# Patient Record
Sex: Female | Born: 1960
Health system: Southern US, Community
[De-identification: ages and names within clinical notes are randomized; demographics above are authoritative.]

## PROBLEM LIST (undated history)

## (undated) DIAGNOSIS — B009 Herpesviral infection, unspecified: Secondary | ICD-10-CM

## (undated) DIAGNOSIS — M1990 Unspecified osteoarthritis, unspecified site: Secondary | ICD-10-CM

## (undated) DIAGNOSIS — E041 Nontoxic single thyroid nodule: Secondary | ICD-10-CM

## (undated) DIAGNOSIS — F419 Anxiety disorder, unspecified: Secondary | ICD-10-CM

## (undated) DIAGNOSIS — I10 Essential (primary) hypertension: Secondary | ICD-10-CM

## (undated) DIAGNOSIS — F329 Major depressive disorder, single episode, unspecified: Secondary | ICD-10-CM

## (undated) DIAGNOSIS — F32A Depression, unspecified: Secondary | ICD-10-CM

## (undated) DIAGNOSIS — H409 Unspecified glaucoma: Secondary | ICD-10-CM

## (undated) DIAGNOSIS — M8580 Other specified disorders of bone density and structure, unspecified site: Secondary | ICD-10-CM

## (undated) HISTORY — DX: Nontoxic single thyroid nodule: E04.1

## (undated) HISTORY — PX: ABDOMINAL HYSTERECTOMY: SHX81

## (undated) HISTORY — DX: Unspecified osteoarthritis, unspecified site: M19.90

## (undated) HISTORY — DX: Major depressive disorder, single episode, unspecified: F32.9

## (undated) HISTORY — DX: Essential (primary) hypertension: I10

## (undated) HISTORY — DX: Other specified disorders of bone density and structure, unspecified site: M85.80

## (undated) HISTORY — PX: BREAST BIOPSY: SHX20

## (undated) HISTORY — DX: Depression, unspecified: F32.A

## (undated) HISTORY — PX: BREAST LUMPECTOMY: SHX2

## (undated) HISTORY — DX: Herpesviral infection, unspecified: B00.9

## (undated) HISTORY — DX: Unspecified glaucoma: H40.9

## (undated) HISTORY — DX: Anxiety disorder, unspecified: F41.9

## (undated) HISTORY — PX: HERNIA REPAIR: SHX51

---

## 2012-01-12 ENCOUNTER — Ambulatory Visit (INDEPENDENT_AMBULATORY_CARE_PROVIDER_SITE_OTHER): Payer: PRIVATE HEALTH INSURANCE | Admitting: Internal Medicine

## 2012-01-12 ENCOUNTER — Encounter: Payer: Self-pay | Admitting: Internal Medicine

## 2012-01-12 VITALS — BP 134/86 | HR 64 | Temp 97.6°F | Resp 20 | Ht 63.0 in | Wt 143.0 lb

## 2012-01-12 DIAGNOSIS — B009 Herpesviral infection, unspecified: Secondary | ICD-10-CM | POA: Insufficient documentation

## 2012-01-12 DIAGNOSIS — F329 Major depressive disorder, single episode, unspecified: Secondary | ICD-10-CM

## 2012-01-12 DIAGNOSIS — F419 Anxiety disorder, unspecified: Secondary | ICD-10-CM

## 2012-01-12 DIAGNOSIS — F1020 Alcohol dependence, uncomplicated: Secondary | ICD-10-CM

## 2012-01-12 DIAGNOSIS — I10 Essential (primary) hypertension: Secondary | ICD-10-CM

## 2012-01-12 DIAGNOSIS — F411 Generalized anxiety disorder: Secondary | ICD-10-CM

## 2012-01-12 DIAGNOSIS — Z0000 Encounter for general adult medical examination without abnormal findings: Secondary | ICD-10-CM

## 2012-01-12 LAB — COMPREHENSIVE METABOLIC PANEL
AST: 17 U/L (ref 0–37)
Albumin: 4.4 g/dL (ref 3.5–5.2)
Alkaline Phosphatase: 53 U/L (ref 39–117)
BUN: 12 mg/dL (ref 6–23)
Potassium: 4.1 mEq/L (ref 3.5–5.3)
Sodium: 137 mEq/L (ref 135–145)
Total Bilirubin: 0.5 mg/dL (ref 0.3–1.2)
Total Protein: 7.3 g/dL (ref 6.0–8.3)

## 2012-01-12 LAB — CBC WITH DIFFERENTIAL/PLATELET
Basophils Absolute: 0 10*3/uL (ref 0.0–0.1)
Basophils Relative: 0 % (ref 0–1)
Eosinophils Absolute: 0.1 10*3/uL (ref 0.0–0.7)
MCHC: 33.9 g/dL (ref 30.0–36.0)
Neutro Abs: 5.2 10*3/uL (ref 1.7–7.7)
Neutrophils Relative %: 61 % (ref 43–77)
RDW: 14.5 % (ref 11.5–15.5)

## 2012-01-12 LAB — LIPID PANEL
HDL: 87 mg/dL (ref >39)
LDL Cholesterol: 87 mg/dL (ref 0–99)
VLDL: 23 mg/dL (ref 0–40)

## 2012-01-12 LAB — TSH: TSH: 1.037 u[IU]/mL (ref 0.350–4.500)
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
## 2012-01-12 NOTE — Patient Instructions (Addendum)
Call [facility]  [phone]  Ask for Dr. Donell Beers     Labs will be mailed to you

## 2012-01-13 ENCOUNTER — Encounter: Payer: Self-pay | Admitting: Internal Medicine

## 2012-01-13 DIAGNOSIS — I10 Essential (primary) hypertension: Secondary | ICD-10-CM | POA: Insufficient documentation

## 2012-01-13 DIAGNOSIS — F1020 Alcohol dependence, uncomplicated: Secondary | ICD-10-CM | POA: Insufficient documentation

## 2012-01-13 DIAGNOSIS — F329 Major depressive disorder, single episode, unspecified: Secondary | ICD-10-CM | POA: Insufficient documentation

## 2012-01-13 DIAGNOSIS — F419 Anxiety disorder, unspecified: Secondary | ICD-10-CM | POA: Insufficient documentation

## 2012-01-18 ENCOUNTER — Telehealth: Payer: Self-pay | Admitting: Internal Medicine

## 2012-01-18 NOTE — Telephone Encounter (Signed)
Spoke with pt and informed of lab results  Her appt with psychiatry is pending

## 2012-01-20 NOTE — Addendum Note (Signed)
Addended by: Durwin Nora B on: 01/20/2012 03:23 PM   Modules accepted: Orders

## 2012-01-20 NOTE — ED Provider Notes (Signed)
Order(s) created erroneously. Erroneous order ID: [id number] Order moved by: Lurline Hare Order move date/time: 01/20/2012  3:20 PM Source Patient:   [id number] Source Contact: 01/12/2012 Destination Patient:   [id number] Destination Contact: 02/15/2011

## 2012-01-30 ENCOUNTER — Encounter: Payer: Self-pay | Admitting: Internal Medicine

## 2012-01-30 DIAGNOSIS — R921 Mammographic calcification found on diagnostic imaging of breast: Secondary | ICD-10-CM | POA: Insufficient documentation

## 2012-01-30 DIAGNOSIS — Z862 Personal history of diseases of the blood and blood-forming organs and certain disorders involving the immune mechanism: Secondary | ICD-10-CM | POA: Insufficient documentation

## 2012-01-30 DIAGNOSIS — Z90710 Acquired absence of both cervix and uterus: Secondary | ICD-10-CM | POA: Insufficient documentation

## 2012-01-30 DIAGNOSIS — E042 Nontoxic multinodular goiter: Secondary | ICD-10-CM | POA: Insufficient documentation

## 2012-01-30 DIAGNOSIS — Z86010 Personal history of colonic polyps: Secondary | ICD-10-CM | POA: Insufficient documentation

## 2012-02-08 ENCOUNTER — Other Ambulatory Visit: Payer: Self-pay | Admitting: Internal Medicine

## 2012-02-08 ENCOUNTER — Other Ambulatory Visit: Payer: Self-pay | Admitting: *Deleted

## 2012-02-08 MED ORDER — AMLODIPINE BESY-BENAZEPRIL HCL 5-10 MG PO CAPS: 1.0000 | ORAL_CAPSULE | Freq: Every day | ORAL | Status: DC

## 2012-02-08 MED ORDER — HYDROCHLOROTHIAZIDE 12.5 MG PO CAPS: 12.5000 mg | ORAL_CAPSULE | Freq: Every day | ORAL | Status: DC

## 2012-02-08 NOTE — Telephone Encounter (Signed)
Left message for pt regarding pharmacy information awaiting return call

## 2012-02-08 NOTE — Telephone Encounter (Signed)
Anna Werner    Call in this and put pts pharmacy in chart

## 2012-02-08 NOTE — Telephone Encounter (Signed)
Pt needs refills on HCTZ and Lotrel 5/10 mg.  Also needs Clindamycin 1 % gel & Mometasone cream 0.1%.  Call [facility] [phone].  Pt phone number [phone].

## 2012-02-08 NOTE — Telephone Encounter (Signed)
Pharmacy updated.

## 2012-02-08 NOTE — Telephone Encounter (Signed)
Pt returned call regarding pharmacy info will update it in chart

## 2012-02-09 ENCOUNTER — Other Ambulatory Visit: Payer: Self-pay | Admitting: *Deleted

## 2012-02-11 ENCOUNTER — Telehealth: Payer: Self-pay | Admitting: *Deleted

## 2012-02-11 ENCOUNTER — Other Ambulatory Visit: Payer: Self-pay | Admitting: *Deleted

## 2012-02-11 MED ORDER — MOMETASONE FUROATE 0.1 % EX CREA: TOPICAL_CREAM | Freq: Every day | CUTANEOUS | Status: DC

## 2012-02-11 MED ORDER — CLINDAMYCIN PHOSPHATE 1 % EX GEL: Freq: Two times a day (BID) | CUTANEOUS | Status: DC

## 2012-02-11 NOTE — Addendum Note (Signed)
Addended by: Raechel Chute D on: 02/11/2012 12:21 PM   Modules accepted: Orders

## 2012-02-11 NOTE — Telephone Encounter (Signed)
lotrel and HCTZ called in again (pt had given wrong pharmacy) Anna Werner, also spoke with pt regarding prescriber and reason for abx cream

## 2012-02-14 ENCOUNTER — Telehealth: Payer: Self-pay | Admitting: *Deleted

## 2012-02-14 NOTE — Telephone Encounter (Signed)
Pt returned call

## 2012-02-21 ENCOUNTER — Telehealth: Payer: Self-pay | Admitting: *Deleted

## 2012-02-21 NOTE — Telephone Encounter (Signed)
Left message for pt regarding her incoming call Friday for an appt for groin and back pain

## 2012-02-25 ENCOUNTER — Encounter: Payer: Self-pay | Admitting: *Deleted

## 2012-03-15 ENCOUNTER — Ambulatory Visit (INDEPENDENT_AMBULATORY_CARE_PROVIDER_SITE_OTHER): Payer: PRIVATE HEALTH INSURANCE | Admitting: Internal Medicine

## 2012-03-15 ENCOUNTER — Encounter: Payer: Self-pay | Admitting: Internal Medicine

## 2012-03-15 VITALS — BP 124/76 | HR 71 | Temp 97.5°F | Resp 18 | Wt 142.0 lb

## 2012-03-15 DIAGNOSIS — M1990 Unspecified osteoarthritis, unspecified site: Secondary | ICD-10-CM

## 2012-03-15 DIAGNOSIS — M129 Arthropathy, unspecified: Secondary | ICD-10-CM

## 2012-03-15 DIAGNOSIS — M2550 Pain in unspecified joint: Secondary | ICD-10-CM

## 2012-03-15 LAB — CBC WITH DIFFERENTIAL/PLATELET
Basophils Absolute: 0 10*3/uL (ref 0.0–0.1)
Basophils Relative: 0 % (ref 0–1)
Eosinophils Absolute: 0.1 10*3/uL (ref 0.0–0.7)
Eosinophils Relative: 1 % (ref 0–5)
MCH: 24.6 pg — ABNORMAL LOW (ref 26.0–34.0)
MCV: 73.8 fL — ABNORMAL LOW (ref 78.0–100.0)
Platelets: 278 10*3/uL (ref 150–400)
RDW: 14.5 % (ref 11.5–15.5)
WBC: 9.3 10*3/uL (ref 4.0–10.5)

## 2012-03-15 MED ORDER — NABUMETONE 500 MG PO TABS: 500.0000 mg | ORAL_TABLET | Freq: Two times a day (BID) | ORAL | Status: DC
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
## 2012-03-15 NOTE — Patient Instructions (Addendum)
Check labs on my chart  Will refer to Dr. Corliss Skains

## 2012-03-20 ENCOUNTER — Encounter: Payer: Self-pay | Admitting: *Deleted

## 2012-03-21 ENCOUNTER — Telehealth: Payer: Self-pay | Admitting: *Deleted

## 2012-03-21 NOTE — Telephone Encounter (Signed)
Labs mailed to pt home address.

## 2012-04-24 ENCOUNTER — Other Ambulatory Visit: Payer: Self-pay | Admitting: Internal Medicine

## 2012-04-24 MED ORDER — ALPRAZOLAM 0.5 MG PO TABS: 0.5000 mg | ORAL_TABLET | Freq: Every evening | ORAL | Status: DC | PRN

## 2012-04-24 NOTE — Telephone Encounter (Signed)
Xanax called in to [facility]

## 2012-04-24 NOTE — Telephone Encounter (Signed)
Will phone in pending approval

## 2012-04-24 NOTE — Telephone Encounter (Signed)
Ok to phone in.

## 2012-04-24 NOTE — Telephone Encounter (Signed)
Pt needs refill on her ALPRAZolam (XANAX) 0.5 MG tablet Sent to [facility] [address] Pt states she does not has any medication left .Marland KitchenMarland Kitchen

## 2012-04-25 ENCOUNTER — Telehealth: Payer: Self-pay | Admitting: *Deleted

## 2012-04-25 NOTE — Telephone Encounter (Signed)
Called in rx for xanax again

## 2012-06-15 ENCOUNTER — Other Ambulatory Visit: Payer: Self-pay | Admitting: Internal Medicine

## 2012-06-16 ENCOUNTER — Other Ambulatory Visit: Payer: Self-pay | Admitting: *Deleted

## 2012-06-16 NOTE — Telephone Encounter (Signed)
Xanax called in to [facility] 

## 2012-06-16 NOTE — Telephone Encounter (Signed)
Will call in pending approval 

## 2012-06-24 ENCOUNTER — Other Ambulatory Visit: Payer: Self-pay

## 2012-06-29 LAB — HM COLONOSCOPY

## 2012-07-02 ENCOUNTER — Encounter: Payer: Self-pay | Admitting: Internal Medicine

## 2012-08-24 ENCOUNTER — Telehealth: Payer: Self-pay | Admitting: Internal Medicine

## 2012-08-24 NOTE — Telephone Encounter (Signed)
Pt needs refill for Lotrel and Xanax.  To be called into pharmacy [address] [phone].  Pt scheduled for CPE on December 13, 2012.  Call back phone number for pt [phone].

## 2012-08-28 ENCOUNTER — Other Ambulatory Visit: Payer: Self-pay | Admitting: *Deleted

## 2012-08-28 ENCOUNTER — Other Ambulatory Visit: Payer: Self-pay | Admitting: Internal Medicine

## 2012-08-28 MED ORDER — AMLODIPINE BESY-BENAZEPRIL HCL 5-10 MG PO CAPS: 1.0000 | ORAL_CAPSULE | Freq: Every day | ORAL | Status: DC

## 2012-08-28 NOTE — Telephone Encounter (Signed)
Refill request

## 2012-08-29 ENCOUNTER — Other Ambulatory Visit: Payer: Self-pay | Admitting: *Deleted

## 2012-08-29 NOTE — Telephone Encounter (Signed)
Will call in pending approval 

## 2012-08-30 MED ORDER — ALPRAZOLAM 0.5 MG PO TABS: ORAL_TABLET | ORAL | Status: DC

## 2012-09-06 ENCOUNTER — Telehealth: Payer: Self-pay | Admitting: Internal Medicine

## 2012-09-06 NOTE — Telephone Encounter (Signed)
Left message on pts mobile to call me at the office to discuss extreme breast density on mm report

## 2012-09-07 ENCOUNTER — Telehealth: Payer: Self-pay | Admitting: Internal Medicine

## 2012-09-07 DIAGNOSIS — R922 Inconclusive mammogram: Secondary | ICD-10-CM | POA: Insufficient documentation

## 2012-09-07 DIAGNOSIS — R9230 Dense breasts, unspecified: Secondary | ICD-10-CM

## 2012-09-07 NOTE — Telephone Encounter (Signed)
Spoke with pt and infromed of extreme breast density of mm.  She would like a 3-D mm  Will set up at [facility]

## 2012-09-12 ENCOUNTER — Encounter: Payer: Self-pay | Admitting: *Deleted

## 2012-09-20 ENCOUNTER — Other Ambulatory Visit: Payer: Self-pay | Admitting: Internal Medicine

## 2012-09-26 ENCOUNTER — Other Ambulatory Visit: Payer: Self-pay | Admitting: *Deleted

## 2012-09-26 NOTE — Telephone Encounter (Signed)
Refill request

## 2012-09-27 ENCOUNTER — Telehealth: Payer: Self-pay | Admitting: *Deleted

## 2012-09-27 MED ORDER — HYDROCHLOROTHIAZIDE 12.5 MG PO CAPS: 12.5000 mg | ORAL_CAPSULE | Freq: Every day | ORAL | Status: DC

## 2012-09-27 NOTE — Telephone Encounter (Signed)
HCTZ called in to pharmacy

## 2012-11-07 ENCOUNTER — Other Ambulatory Visit: Payer: Self-pay | Admitting: *Deleted

## 2012-11-07 MED ORDER — ALPRAZOLAM 0.5 MG PO TABS: ORAL_TABLET | ORAL | Status: DC

## 2012-11-07 NOTE — Telephone Encounter (Signed)
Ok to call in #20 no RF

## 2012-11-07 NOTE — Telephone Encounter (Signed)
Will call in pending approval 

## 2012-11-13 ENCOUNTER — Other Ambulatory Visit: Payer: Self-pay | Admitting: *Deleted

## 2012-11-13 NOTE — Telephone Encounter (Signed)
Xanax called into pharmacy.

## 2012-11-28 ENCOUNTER — Ambulatory Visit: Payer: PRIVATE HEALTH INSURANCE | Admitting: Internal Medicine

## 2012-12-13 ENCOUNTER — Ambulatory Visit (INDEPENDENT_AMBULATORY_CARE_PROVIDER_SITE_OTHER): Payer: PRIVATE HEALTH INSURANCE | Admitting: Internal Medicine

## 2012-12-13 ENCOUNTER — Ambulatory Visit (HOSPITAL_BASED_OUTPATIENT_CLINIC_OR_DEPARTMENT_OTHER)
Admission: RE | Admit: 2012-12-13 | Discharge: 2012-12-13 | Disposition: A | Discharge status: Home / Self Care | Payer: PRIVATE HEALTH INSURANCE | Source: Ambulatory Visit | Attending: Internal Medicine | Admitting: Internal Medicine

## 2012-12-13 ENCOUNTER — Encounter: Payer: Self-pay | Admitting: Internal Medicine

## 2012-12-13 VITALS — BP 128/79 | HR 69 | Resp 16 | Ht 63.0 in | Wt 146.0 lb

## 2012-12-13 DIAGNOSIS — R001 Bradycardia, unspecified: Secondary | ICD-10-CM | POA: Insufficient documentation

## 2012-12-13 DIAGNOSIS — R921 Mammographic calcification found on diagnostic imaging of breast: Secondary | ICD-10-CM

## 2012-12-13 DIAGNOSIS — Z8639 Personal history of other endocrine, nutritional and metabolic disease: Secondary | ICD-10-CM

## 2012-12-13 DIAGNOSIS — F1020 Alcohol dependence, uncomplicated: Secondary | ICD-10-CM

## 2012-12-13 DIAGNOSIS — R928 Other abnormal and inconclusive findings on diagnostic imaging of breast: Secondary | ICD-10-CM

## 2012-12-13 DIAGNOSIS — I10 Essential (primary) hypertension: Secondary | ICD-10-CM

## 2012-12-13 DIAGNOSIS — Z23 Encounter for immunization: Secondary | ICD-10-CM

## 2012-12-13 DIAGNOSIS — Z139 Encounter for screening, unspecified: Secondary | ICD-10-CM

## 2012-12-13 DIAGNOSIS — Z0000 Encounter for general adult medical examination without abnormal findings: Secondary | ICD-10-CM

## 2012-12-13 DIAGNOSIS — E049 Nontoxic goiter, unspecified: Secondary | ICD-10-CM | POA: Insufficient documentation

## 2012-12-13 DIAGNOSIS — Z90710 Acquired absence of both cervix and uterus: Secondary | ICD-10-CM

## 2012-12-13 DIAGNOSIS — Z862 Personal history of diseases of the blood and blood-forming organs and certain disorders involving the immune mechanism: Secondary | ICD-10-CM

## 2012-12-13 LAB — POCT URINALYSIS DIPSTICK
Protein, UA: NEGATIVE
Spec Grav, UA: 1.01
Urobilinogen, UA: 0.2
pH, UA: 7

## 2012-12-13 NOTE — Addendum Note (Signed)
Addended by: Arne Cleveland on: 12/13/2012 06:29 PM   Modules accepted: Orders

## 2012-12-13 NOTE — Progress Notes (Signed)
Subjective:    Patient ID: Anna Werner, female    DOB: 1960-09-01, 52 y.o.   MRN: [id number]  HPI Anna Werner is here for CPE  She tells me she has been sober for 30- days.  Had relapse with ETOH one month ago.  She is not attending any AA meetings now  MNG  Has history of thyroid nodule No symptoms  Extreme breast density  HTN welll controlled  She has lump she feels on R side of scalp that as been present for many years  densies injury or trauma  No Known Allergies Past Medical History  Diagnosis Date  . Anxiety   . Depression   . Arthritis   . Hypertension   . HSV-2 infection    Past Surgical History  Procedure Laterality Date  . Abdominal hysterectomy    . Hernia repair    . Breast lumpectomy     History   Social History  . Marital Status: Divorced    Spouse Name: N/A    Number of Children: N/A  . Years of Education: N/A   Occupational History  . Not on file.   Social History Main Topics  . Smoking status: Former Smoker    Types: Cigarettes  . Smokeless tobacco: Not on file  . Alcohol Use: 1.2 oz/week    2 Glasses of wine per week  . Drug Use: No  . Sexually Active: Yes   Other Topics Concern  . Not on file   Social History Narrative  . No narrative on file   History reviewed. No pertinent family history. Patient Active Problem List   Diagnosis Date Noted  . Breast density 09/07/2012  . Arthralgia 03/15/2012  . Hx of colonic polyps 01/30/2012  . S/P hysterectomy 01/30/2012  . History of anemia 01/30/2012  . Breast calcifications on mammogram 01/30/2012  . History of thyroid nodule 01/30/2012  . Anxiety 01/13/2012  . Depression 01/13/2012  . EtOH dependence 01/13/2012  . Essential hypertension, benign 01/13/2012  . HSV-2 infection    Current Outpatient Prescriptions on File Prior to Visit  Medication Sig Dispense Refill  . ALPRAZolam (XANAX) 0.5 MG tablet Take one tablet daily prn for anxiety  20 tablet  0  . amLODipine-benazepril (LOTREL)  5-10 MG per capsule Take 1 capsule by mouth daily.  90 capsule  1  . aspirin 81 MG tablet Take 81 mg by mouth daily.      . calcium carbonate 200 MG capsule Take 500 mg by mouth 2 (two) times daily with a meal.      . cholecalciferol (VITAMIN D) 1000 UNITS tablet Take 1,000 Units by mouth daily.      . clindamycin (CLINDAGEL) 1 % gel Apply topically 2 (two) times daily.  30 g  0  . cloNIDine (CATAPRES) 0.1 MG tablet Take 0.1 mg by mouth 2 (two) times daily.      . fish oil-omega-3 fatty acids 1000 MG capsule Take 2 g by mouth daily.      . hydrochlorothiazide (MICROZIDE) 12.5 MG capsule Take 1 capsule (12.5 mg total) by mouth daily.  90 capsule  1  . mometasone (ELOCON) 0.1 % cream Apply topically daily.  45 g  0  . Multiple Vitamins-Minerals (MULTI VITAMIN/MINERALS PO) Take by mouth.      . nabumetone (RELAFEN) 500 MG tablet Take 1 tablet (500 mg total) by mouth 2 (two) times daily.  60 tablet  0  . vitamin C (ASCORBIC ACID) 500 MG tablet Take 500 mg  by mouth daily.       No current facility-administered medications on file prior to visit.       Review of Systems    see HPI Objective:   Physical Exam  Physical Exam  Nursing note and vitals reviewed.  Constitutional: She is oriented to person, place, and time. She appears well-developed and well-nourished.  HENT:  Head: Normocephalic and atraumatic.  Cardiovascular: Normal rate and regular rhythm. Exam reveals no gallop and no friction rub.  No murmur heard.  Pulmonary/Chest: Breath sounds normal. She has no wheezes. She has no rales.  Neurological: She is alert and oriented to person, place, and time.  Skin: Skin is warm and dry. Scalp  She has easily mobile 1 cm nodule R parietal area. Psychiatric: She has a normal mood and affect. Her behavior is normal.       Assessment & Plan:  Health Maintenance will give Tdap today.  MM due 07/2013  She had colonoscopy within this past year  Dr. Marcelene Butte  She is S/P hysterectomy  EKG   Sinus bradycardia  HTN  Continue meds  Thyroid nodule  Will get thyroid u/s.  Further management based on results.  Pt counseled ot call for results after xray is performed  Scalp nodule  I gave number to [facility] dermatologist for pt to call    Arthralgias  She sees rheumatolgist for this  ETOH dependence  Relapse 30 days ago.  Encourage 12 step recorvery and [employer]

## 2012-12-14 ENCOUNTER — Other Ambulatory Visit (HOSPITAL_BASED_OUTPATIENT_CLINIC_OR_DEPARTMENT_OTHER): Payer: PRIVATE HEALTH INSURANCE

## 2012-12-14 LAB — CBC WITH DIFFERENTIAL/PLATELET
Basophils Relative: 0 % (ref 0–1)
HCT: 38.5 % (ref 36.0–46.0)
Hemoglobin: 13 g/dL (ref 12.0–15.0)
Lymphs Abs: 2.4 10*3/uL (ref 0.7–4.0)
MCH: 25.1 pg — ABNORMAL LOW (ref 26.0–34.0)
MCHC: 33.8 g/dL (ref 30.0–36.0)
Monocytes Absolute: 0.7 10*3/uL (ref 0.1–1.0)
Monocytes Relative: 9 % (ref 3–12)
Neutro Abs: 4 10*3/uL (ref 1.7–7.7)

## 2012-12-14 LAB — LIPID PANEL
LDL Cholesterol: 98 mg/dL (ref 0–99)
Total CHOL/HDL Ratio: 2.5 Ratio
Triglycerides: 71 mg/dL (ref <150)
VLDL: 14 mg/dL (ref 0–40)

## 2012-12-14 LAB — COMPREHENSIVE METABOLIC PANEL
ALT: 18 U/L (ref 0–35)
AST: 22 U/L (ref 0–37)
Creat: 0.71 mg/dL (ref 0.50–1.10)
Total Bilirubin: 0.3 mg/dL (ref 0.3–1.2)

## 2012-12-18 ENCOUNTER — Encounter: Payer: Self-pay | Admitting: *Deleted

## 2012-12-19 ENCOUNTER — Telehealth: Payer: Self-pay | Admitting: Internal Medicine

## 2012-12-19 DIAGNOSIS — E049 Nontoxic goiter, unspecified: Secondary | ICD-10-CM

## 2012-12-19 NOTE — Telephone Encounter (Signed)
I left voicemail of pt mobile to call office regarding ultrasound

## 2012-12-19 NOTE — Telephone Encounter (Signed)
Spoke with pt. And informed of ultrasound results  Will get endocrinology opinion

## 2013-01-16 ENCOUNTER — Ambulatory Visit (INDEPENDENT_AMBULATORY_CARE_PROVIDER_SITE_OTHER): Payer: PRIVATE HEALTH INSURANCE | Admitting: Internal Medicine

## 2013-01-16 ENCOUNTER — Encounter: Payer: Self-pay | Admitting: Internal Medicine

## 2013-01-16 VITALS — BP 118/84 | HR 81 | Temp 98.1°F | Resp 10 | Ht 64.0 in | Wt 156.0 lb

## 2013-01-16 DIAGNOSIS — Z862 Personal history of diseases of the blood and blood-forming organs and certain disorders involving the immune mechanism: Secondary | ICD-10-CM

## 2013-01-16 DIAGNOSIS — Z8639 Personal history of other endocrine, nutritional and metabolic disease: Secondary | ICD-10-CM

## 2013-01-16 NOTE — Patient Instructions (Addendum)
Please return in a year.  I will let you know about the result of your scan as soon as it becomes available.

## 2013-01-16 NOTE — Progress Notes (Addendum)
Patient ID: Anna Werner, female   DOB: 02-Apr-1961, 52 y.o.   MRN: [id number]   HPI  Anna Werner is a 52 y.o.-year-old female, referred by her PCP, Dr.Schoenhoff, for evaluation for MNG.  She was found to have 1 thyroid nodule in 2012, but had multiple nodules on a recent U/S 1 mo ago. Two nodules are larger, at 1 cm diameter, mixed, without internal calcifications but with internal blood flow.   I reviewed pt's thyroid tests: Lab Results  Component Value Date   TSH 0.958 12/13/2012   TSH 1.037 01/12/2012    Pt denies feeling nodules in neck, hoarseness, dysphagia/odynophagia, SOB with lying down, has a vague discomfort in right side of neck.  Pt c/o: - + heat intolerance = hot flushes - no tremors  - no anxiety/depression - no palpitations - no fatigue - + constipation - + weight gain - no dry skin  Pt does not have a FH of thyroid ds. No FH of thyroid cancer. No h/o radiation tx to head or neck.  No seaweed or kelp, no recent contrast studies. No steroid use. Takes herbal supplements: estroven, algae, cherry tart (for inflammation), alfalfa.   I reviewed her chart and she also has a history of anemia, anxiety.   ROS: Constitutional: see HPI Eyes: no blurry vision, no xerophthalmia ENT: no sore throat, no nodules palpated in throat, no dysphagia/odynophagia, no hoarseness Cardiovascular: no CP/SOB/palpitations/leg swelling Respiratory: no cough/SOB Gastrointestinal: no N/V/D/C Musculoskeletal: no muscle/joint aches Skin: no rashes Neurological: no tremors/numbness/tingling/dizziness Psychiatric: no depression/anxiety  Past Medical History  Diagnosis Date  . Anxiety   . Depression   . Arthritis   . Hypertension   . HSV-2 infection     Past Surgical History  Procedure Laterality Date  . Abdominal hysterectomy    . Hernia repair    . Breast lumpectomy      History   Social History  . Marital Status: Divorced    Spouse Name: N/A    Number of Children: 2    Occupational History  . LPN   Social History Main Topics  . Smoking status: Former Smoker    Types: Cigarettes - stopped 1996  . Smokeless tobacco: Not on file  . Alcohol Use: No  . Drug Use: No  . Sexual Activity: Yes   Social History Narrative   Regular exercise: no   Caffeine use: coffee daily   Current Outpatient Prescriptions on File Prior to Visit  Medication Sig Dispense Refill  . Alfalfa 250 MG TABS Take 1 tablet by mouth daily.      Marland Kitchen ALPRAZolam (XANAX) 0.5 MG tablet Take one tablet daily prn for anxiety  20 tablet  0  . amLODipine-benazepril (LOTREL) 5-10 MG per capsule Take 1 capsule by mouth daily.  90 capsule  1  . aspirin 81 MG tablet Take 81 mg by mouth daily.      . calcium carbonate 200 MG capsule Take 500 mg by mouth 2 (two) times daily with a meal.      . cholecalciferol (VITAMIN D) 1000 UNITS tablet Take 1,000 Units by mouth daily.      . clindamycin (CLINDAGEL) 1 % gel Apply topically 2 (two) times daily.  30 g  0  . cloNIDine (CATAPRES) 0.1 MG tablet Take 0.1 mg by mouth 2 (two) times daily.      . fish oil-omega-3 fatty acids 1000 MG capsule Take 2 g by mouth daily.      . hydrochlorothiazide (MICROZIDE) 12.5 MG  capsule Take 1 capsule (12.5 mg total) by mouth daily.  90 capsule  1  . Lactobacillus (ACIDOPHILUS) TABS Take 1 tablet by mouth daily.      . mometasone (ELOCON) 0.1 % cream Apply topically daily.  45 g  0  . Multiple Vitamins-Minerals (MULTI VITAMIN/MINERALS PO) Take by mouth.      . nabumetone (RELAFEN) 500 MG tablet Take 1 tablet (500 mg total) by mouth 2 (two) times daily.  60 tablet  0  . Nutritional Supplements (ESTROVEN PO) Take 1 tablet by mouth daily.      . vitamin C (ASCORBIC ACID) 500 MG tablet Take 500 mg by mouth daily.       No current facility-administered medications on file prior to visit.   No Known Allergies  History reviewed. No pertinent family history.  PE: BP 118/84  Pulse 81  Temp(Src) 98.1 F (36.7 C) (Oral)   Resp 10  Ht 5\' 4"  (1.626 m)  Wt 156 lb (70.761 kg)  BMI 26.76 kg/m2  SpO2 97% Wt Readings from Last 3 Encounters:  01/16/13 156 lb (70.761 kg)  12/13/12 146 lb (66.225 kg)  03/15/12 142 lb (64.411 kg)   Constitutional: slightly overweight, in NAD Eyes: PERRLA, EOMI, no exophthalmos ENT: moist mucous membranes, + thyromegaly - lumpy-bumpy at palpation, no cervical lymphadenopathy Cardiovascular: RRR, No MRG Respiratory: CTA B Gastrointestinal: abdomen soft, NT, ND, BS+ Musculoskeletal: no deformities, strength intact in all 4;  Skin: moist, warm, no rashes Neurological: no tremor with outstretched hands, DTR normal in all 4  ASSESSMENT: 1. MNG - thyroid U/S (12/13/2012):  Right thyroid lobe: Measures 7.5 x 1.9 x 1.9 cm   Left thyroid lobe: Measures 6.8 x 1.7 by 1.6 cm.   Isthmus: Measures 3 mm thickness.   Focal nodules: A nodule in the mid to superior aspect of the right lobe measures 1 x 0.5 x 0.7 cm. At least three additional smaller nodules are noted in the right lobe. A heterogeneous nodule inferior aspect of the left lobe measures 9 x 10 mm. A tiny nodule is noted in the mid aspect of the left lobe.   Lymphadenopathy: None visualized.  1. MNG  - I reviewed the images of her thyroid ultrasound along with the patient. I pointed out that the dominant nodules are not large, mixed, without calcifications, are well delimited from surrounding tissue, but they do have internal blood flow, which can appear if the nodule is hyperfunctioning or cancerous. Pt does not have a thyroid cancer family history or a personal history of RxTx to head/neck. All these would favor benignity. I believe her risk of cancer is less than15%.  - the only way that we can tell exactly if it is cancer or not is by doing a thyroid biopsy (FNA). I explained what the test entails. She is anxious to have this done. - We discussed about other options, to wait for another year and see if the nodule grows, and  only intervene at that time.  - I explained that this is not cancer, we can continue to follow her on a yearly basis, and check another ultrasound in another year or 2. - we also discussed about the indolent nature of the thyroid cancer and follow up after thyroidectomy if we need to go that route. - patient is anxious and does not think she can wait a year to know if the nodules are cancerous. However, she is reticent to have FNA done now - we decided to check  a thyroid scan to see if the nodules are hyperfunctioning ("hot") - if they are, no FNA is indicated as the risk of cancer is almost 0. I ordered this. Advised her to stop the algae supplement (increased iodine).  - I'll see her back in a year, assuming her FNA is normal. If FNA abnormal, we will meet sooner.  - I will send her the results through MyChart  Orders Placed This Encounter  Procedures  . NM THYROID SNG UPTAKE W/IMAGING   02/07/2013 Uptake and scan:  CLINICAL DATA: Thyroid nodules on ultrasound.  EXAM:  THYROID SCAN  TECHNIQUE:  Following the intravenous administration of radiopharmaceutical, pin  hole collimated images were obtained of the thyroid gland.  COMPARISON: Thyroid ultrasound 12/13/2012.  RADIOPHARMACEUTICALS: 12.2 mCi Tc-37m Pertechnetate  FINDINGS:  The thyroid uptake is mildly heterogeneous. There is a small "warm  nodule" laterally in the mid right lobe. There is a possible small  cold nodule inferiorly on the left. No other focal abnormalities are  identified.  IMPRESSION:  Heterogeneous thyroid uptake with small warm nodule on the right and  possible small cold nodule on the left. Based on prior ultrasound  findings, ultrasound follow up in 6 months suggested.  Electronically Signed  By: Roxy Horseman  On: 02/07/2013 16:40  I called and discussed with the patient about the above results, suggesting that, since the right thyroid nodule is" warm", we should not worry about it since the risk of cancer  is almost 0. However, on the left, there is a cold nodule, probably corresponding to the 1 x 0.9 cm nodule seen on ultrasound. I believe that these will need to be reevaluated, but, since the nodule is so small, I would suggest that we perform another ultrasound in a year to check for growth, and perform an FNA if it increases in size. I discussed with the patient that even if this turns out to be cancer,  1-year wait do not make a difference for  Prognosis. I also discussed that, if she prefers, we can also try to biopsy it, but that would not be my first choice. She agrees with the plan. I will see her back in a year but I advised her to let me know if he develops neck compression symptoms in the interim.

## 2013-01-18 ENCOUNTER — Other Ambulatory Visit: Payer: Self-pay | Admitting: *Deleted

## 2013-01-18 MED ORDER — ALPRAZOLAM 0.5 MG PO TABS: ORAL_TABLET | ORAL | Status: DC

## 2013-01-18 NOTE — Telephone Encounter (Signed)
Refill request will call in pending approval 

## 2013-01-19 ENCOUNTER — Telehealth: Payer: Self-pay | Admitting: Internal Medicine

## 2013-01-25 ENCOUNTER — Ambulatory Visit (HOSPITAL_COMMUNITY): Payer: PRIVATE HEALTH INSURANCE

## 2013-01-26 ENCOUNTER — Other Ambulatory Visit (HOSPITAL_COMMUNITY): Payer: PRIVATE HEALTH INSURANCE

## 2013-02-07 ENCOUNTER — Other Ambulatory Visit: Payer: Self-pay | Admitting: Internal Medicine

## 2013-02-07 ENCOUNTER — Encounter (HOSPITAL_COMMUNITY)
Admission: RE | Admit: 2013-02-07 | Discharge: 2013-02-07 | Disposition: A | Discharge status: Home / Self Care | Payer: PRIVATE HEALTH INSURANCE | Source: Ambulatory Visit | Attending: Internal Medicine | Admitting: Internal Medicine

## 2013-02-07 DIAGNOSIS — Z8639 Personal history of other endocrine, nutritional and metabolic disease: Secondary | ICD-10-CM

## 2013-02-07 DIAGNOSIS — E041 Nontoxic single thyroid nodule: Secondary | ICD-10-CM | POA: Insufficient documentation

## 2013-02-07 MED ORDER — SODIUM PERTECHNETATE TC 99M INJECTION
12.2000 | Freq: Once | INTRAVENOUS | Status: AC | PRN
  Administered 2013-02-07: 12.2 via INTRAVENOUS

## 2013-02-08 ENCOUNTER — Encounter (HOSPITAL_COMMUNITY): Payer: PRIVATE HEALTH INSURANCE

## 2013-02-09 ENCOUNTER — Telehealth: Payer: Self-pay | Admitting: Internal Medicine

## 2013-02-09 NOTE — Telephone Encounter (Signed)
Pt is requesting her results that she had done at the hospital.  CB # [phone]

## 2013-02-09 NOTE — Telephone Encounter (Signed)
I called and discussed with her Re: results.

## 2013-02-09 NOTE — Telephone Encounter (Signed)
Pt called requesting results from test. Please advise.

## 2013-02-12 NOTE — Telephone Encounter (Signed)
Noted,

## 2013-02-20 ENCOUNTER — Other Ambulatory Visit: Payer: Self-pay | Admitting: *Deleted

## 2013-02-20 NOTE — Telephone Encounter (Signed)
Notified pt of an appt opening tomorrow am pt given # to [place] so that she could make an appt

## 2013-02-20 NOTE — Telephone Encounter (Signed)
Pt called for appt for not feeling well and swollen lymph nodes will schedule her with [place] this PM pt also needs medication refills

## 2013-02-22 ENCOUNTER — Telehealth: Payer: Self-pay | Admitting: *Deleted

## 2013-02-22 NOTE — Telephone Encounter (Signed)
Anna Werner called wanting an appt to see Dr Reece Levy.  I told her Dr Reece Levy was out of town, and we could schedule her with the dr covering. She declined the appt.  I offered her appts next week, but she declined those appts as well. She said it must be after 3pm, but we are fully booked on all appts after 3 pm.

## 2013-02-25 MED ORDER — AMLODIPINE BESY-BENAZEPRIL HCL 5-10 MG PO CAPS: 1.0000 | ORAL_CAPSULE | Freq: Every day | ORAL | Status: DC

## 2013-02-25 MED ORDER — HYDROCHLOROTHIAZIDE 12.5 MG PO CAPS: 12.5000 mg | ORAL_CAPSULE | Freq: Every day | ORAL | Status: DC

## 2013-02-25 MED ORDER — CLINDAMYCIN PHOSPHATE 1 % EX GEL: Freq: Two times a day (BID) | CUTANEOUS | Status: DC

## 2013-02-26 ENCOUNTER — Telehealth: Payer: Self-pay | Admitting: *Deleted

## 2013-02-26 NOTE — Telephone Encounter (Signed)
Called in HCTZ to [facility]

## 2013-02-28 ENCOUNTER — Telehealth: Payer: Self-pay | Admitting: *Deleted

## 2013-02-28 MED ORDER — AMLODIPINE BESY-BENAZEPRIL HCL 5-10 MG PO CAPS: 1.0000 | ORAL_CAPSULE | Freq: Every day | ORAL | Status: DC

## 2013-02-28 MED ORDER — MOMETASONE FUROATE 0.1 % EX CREA: TOPICAL_CREAM | Freq: Every day | CUTANEOUS | Status: DC

## 2013-02-28 MED ORDER — CLINDAMYCIN PHOSPHATE 1 % EX GEL: Freq: Two times a day (BID) | CUTANEOUS | Status: DC

## 2013-02-28 NOTE — Telephone Encounter (Signed)
Anna Werner called and said that pharmacy only has 2 of her 4 Rx.  She still needs amLODipine-benazepril (LOTREL) 5-10 MG per capsule  and mometasone (ELOCON) 0.1 % cream called in.

## 2013-03-07 ENCOUNTER — Ambulatory Visit (INDEPENDENT_AMBULATORY_CARE_PROVIDER_SITE_OTHER): Payer: PRIVATE HEALTH INSURANCE | Admitting: Internal Medicine

## 2013-03-07 ENCOUNTER — Encounter: Payer: Self-pay | Admitting: Internal Medicine

## 2013-03-07 VITALS — BP 116/75 | HR 80 | Temp 97.7°F | Resp 18 | Wt 148.0 lb

## 2013-03-07 DIAGNOSIS — R591 Generalized enlarged lymph nodes: Secondary | ICD-10-CM | POA: Insufficient documentation

## 2013-03-07 DIAGNOSIS — Z862 Personal history of diseases of the blood and blood-forming organs and certain disorders involving the immune mechanism: Secondary | ICD-10-CM

## 2013-03-07 DIAGNOSIS — R599 Enlarged lymph nodes, unspecified: Secondary | ICD-10-CM

## 2013-03-07 DIAGNOSIS — Z8639 Personal history of other endocrine, nutritional and metabolic disease: Secondary | ICD-10-CM

## 2013-03-07 NOTE — Progress Notes (Signed)
Subjective:    Patient ID: Anna Werner, female    DOB: 08/26/1960, 52 y.o.   MRN: [id number]  HPI  Anna Werner is here for acute visit  She had seen Dr. Elvera Lennox for her thyroid evaluation.    Nuclear imaging was done in Sept and reported a hot nodule and a "possible small cold nodule in R lobe"  She is concerned about swollen lymph nodes on R side of her neck behind her R ear near occiput.  She describes that she had a sore throat 3-4 weeks ago and she was treated with amoxicillin.  She called a telephone doctor service through her insurance (1-800-[employer]) and was given an antibiotic.  She finished amoxicillin about 3 weeks ago. Pharyngitis symptoms have resolved   She reports the Right sided lymph node was "huge" and has decreased in size but she can still feel it  No Known Allergies Past Medical History  Diagnosis Date  . Anxiety   . Depression   . Arthritis   . Hypertension   . HSV-2 infection    Past Surgical History  Procedure Laterality Date  . Abdominal hysterectomy    . Hernia repair    . Breast lumpectomy     History   Social History  . Marital Status: Divorced    Spouse Name: N/A    Number of Children: N/A  . Years of Education: N/A   Occupational History  . Not on file.   Social History Main Topics  . Smoking status: Former Smoker    Types: Cigarettes  . Smokeless tobacco: Not on file  . Alcohol Use: No  . Drug Use: No  . Sexual Activity: Yes   Other Topics Concern  . Not on file   Social History Narrative   Regular exercise: no   Caffeine use: coffee daily   History reviewed. No pertinent family history. Patient Active Problem List   Diagnosis Date Noted  . Lymphadenopathy 03/07/2013  . Sinus bradycardia 12/13/2012  . Breast density 09/07/2012  . Arthralgia 03/15/2012  . Hx of colonic polyps 01/30/2012  . S/P hysterectomy 01/30/2012  . History of anemia 01/30/2012  . Breast calcifications on mammogram 01/30/2012  . History of thyroid nodule  01/30/2012  . Anxiety 01/13/2012  . Depression 01/13/2012  . EtOH dependence 01/13/2012  . Essential hypertension, benign 01/13/2012  . HSV-2 infection    Current Outpatient Prescriptions on File Prior to Visit  Medication Sig Dispense Refill  . ALPRAZolam (XANAX) 0.5 MG tablet Take one tablet daily prn for anxiety  20 tablet  0  . amLODipine-benazepril (LOTREL) 5-10 MG per capsule Take 1 capsule by mouth daily.  90 capsule  1  . aspirin 81 MG tablet Take 81 mg by mouth daily.      . calcium carbonate 200 MG capsule Take 500 mg by mouth 2 (two) times daily with a meal.      . cholecalciferol (VITAMIN D) 1000 UNITS tablet Take 1,000 Units by mouth daily.      . clindamycin (CLINDAGEL) 1 % gel Apply topically 2 (two) times daily.  30 g  0  . cloNIDine (CATAPRES) 0.1 MG tablet Take 0.1 mg by mouth 2 (two) times daily.      . fish oil-omega-3 fatty acids 1000 MG capsule Take 2 g by mouth daily.      . hydrochlorothiazide (MICROZIDE) 12.5 MG capsule Take 1 capsule (12.5 mg total) by mouth daily.  90 capsule  1  . Lactobacillus (ACIDOPHILUS) TABS Take 1  tablet by mouth daily.      . mometasone (ELOCON) 0.1 % cream Apply topically daily.  45 g  0  . Multiple Vitamins-Minerals (MULTI VITAMIN/MINERALS PO) Take by mouth.      . Nutritional Supplements (ESTROVEN PO) Take 1 tablet by mouth daily.      . vitamin C (ASCORBIC ACID) 500 MG tablet Take 500 mg by mouth daily.      . Alfalfa 250 MG TABS Take 1 tablet by mouth daily.      . nabumetone (RELAFEN) 500 MG tablet Take 1 tablet (500 mg total) by mouth 2 (two) times daily.  60 tablet  0   No current facility-administered medications on file prior to visit.         Review of Systems See HPI     Objective:   Physical Exam Physical Exam  Nursing note and vitals reviewed.  Constitutional: She is oriented to person, place, and time. She appears well-developed and well-nourished.  HENT:  Head: Normocephalic and atraumatic. O/P  No redeness  or lesions TM's  Good landmarks no effusions  Ears no redness or rash of lobes  Neck  No thyromegaly no discrete nodes to my exam Cardiovascular: Normal rate and regular rhythm. Exam reveals no gallop and no friction rub.  No murmur heard.  Pulmonary/Chest: Breath sounds normal. She has no wheezes. She has no rales.  Lymph  She has small lymph node behind her R ear  Near occiput  And I can palpate ant cervical node on the left.  There is not clavicular, axillary, antecubital or groin nodes palpable Neurological: She is alert and oriented to person, place, and time.  Skin: Skin is warm and dry.   I do not see any scalp lesions Psychiatric: She has a normal mood and affect. Her behavior is normal.         Assessment & Plan:  Cervical adenopathy  Given length of time and she still has a palpable node on R after antibiotic treatment,  Will get cervical CT to assess size of lymph node.  Nodular thyroid  See Dr. Althea Charon note  Further management based on Ct results

## 2013-03-08 ENCOUNTER — Ambulatory Visit (HOSPITAL_BASED_OUTPATIENT_CLINIC_OR_DEPARTMENT_OTHER)
Admission: RE | Admit: 2013-03-08 | Discharge: 2013-03-08 | Disposition: A | Discharge status: Home / Self Care | Payer: PRIVATE HEALTH INSURANCE | Source: Ambulatory Visit | Attending: Internal Medicine | Admitting: Internal Medicine

## 2013-03-08 ENCOUNTER — Telehealth: Payer: Self-pay | Admitting: *Deleted

## 2013-03-08 DIAGNOSIS — E041 Nontoxic single thyroid nodule: Secondary | ICD-10-CM | POA: Insufficient documentation

## 2013-03-08 DIAGNOSIS — R599 Enlarged lymph nodes, unspecified: Secondary | ICD-10-CM

## 2013-03-08 MED ORDER — IOHEXOL 300 MG/ML  SOLN
75.0000 mL | Freq: Once | INTRAMUSCULAR | Status: AC | PRN
  Administered 2013-03-08: 75 mL via INTRAVENOUS

## 2013-03-08 NOTE — Telephone Encounter (Signed)
Called Anna Werner and left message that we got approval for CT scan and that she had an appt today at 5:00 pm

## 2013-03-12 ENCOUNTER — Encounter: Payer: Self-pay | Admitting: Internal Medicine

## 2013-03-15 ENCOUNTER — Other Ambulatory Visit: Payer: Self-pay

## 2013-03-20 ENCOUNTER — Other Ambulatory Visit: Payer: Self-pay | Admitting: *Deleted

## 2013-03-20 NOTE — Telephone Encounter (Signed)
Refill request will call in pending approval 

## 2013-03-21 ENCOUNTER — Other Ambulatory Visit: Payer: Self-pay | Admitting: *Deleted

## 2013-03-21 ENCOUNTER — Telehealth: Payer: Self-pay | Admitting: *Deleted

## 2013-03-21 NOTE — Telephone Encounter (Signed)
Anna Werner needs her medications refilled. She is leaving town on Friday and needs before she leaves.  mometasone (ELOCON) 0.1 % cream   ALPRAZolam (XANAX) 0.5 MG tablet

## 2013-03-21 NOTE — Telephone Encounter (Signed)
Refill request will call in pending approval 

## 2013-03-22 MED ORDER — MOMETASONE FUROATE 0.1 % EX CREA: TOPICAL_CREAM | Freq: Every day | CUTANEOUS | Status: DC

## 2013-03-22 MED ORDER — ALPRAZOLAM 0.5 MG PO TABS: ORAL_TABLET | ORAL | Status: DC

## 2013-03-22 NOTE — Telephone Encounter (Signed)
Anna Werner  See chart this was refilled on  11/12

## 2013-03-23 NOTE — Telephone Encounter (Signed)
Called in xanax to  [facility]

## 2013-04-16 ENCOUNTER — Ambulatory Visit: Payer: PRIVATE HEALTH INSURANCE | Admitting: Internal Medicine

## 2013-05-10 HISTORY — PX: BIOPSY THYROID: PRO38

## 2013-05-22 ENCOUNTER — Other Ambulatory Visit: Payer: Self-pay | Admitting: Internal Medicine

## 2013-05-22 DIAGNOSIS — F419 Anxiety disorder, unspecified: Secondary | ICD-10-CM

## 2013-05-22 NOTE — Telephone Encounter (Signed)
Refill req for Xanax

## 2013-05-23 MED ORDER — ALPRAZOLAM 0.5 MG PO TABS: ORAL_TABLET | ORAL | Status: DC

## 2013-06-19 ENCOUNTER — Encounter: Payer: Self-pay | Admitting: Internal Medicine

## 2013-06-19 ENCOUNTER — Ambulatory Visit (INDEPENDENT_AMBULATORY_CARE_PROVIDER_SITE_OTHER): Payer: PRIVATE HEALTH INSURANCE | Admitting: Internal Medicine

## 2013-06-19 VITALS — BP 163/88 | HR 67 | Temp 97.9°F | Resp 18 | Wt 150.0 lb

## 2013-06-19 DIAGNOSIS — N951 Menopausal and female climacteric states: Secondary | ICD-10-CM | POA: Insufficient documentation

## 2013-06-19 DIAGNOSIS — R001 Bradycardia, unspecified: Secondary | ICD-10-CM

## 2013-06-19 DIAGNOSIS — I10 Essential (primary) hypertension: Secondary | ICD-10-CM

## 2013-06-19 DIAGNOSIS — I498 Other specified cardiac arrhythmias: Secondary | ICD-10-CM

## 2013-06-19 MED ORDER — LOSARTAN POTASSIUM-HCTZ 100-25 MG PO TABS: 1.0000 | ORAL_TABLET | Freq: Every day | ORAL | Status: DC

## 2013-06-19 NOTE — Progress Notes (Signed)
Subjective:    Patient ID: Anna Werner, female    DOB: 14-Jun-1960, 53 y.o.   MRN: [id number]  HPI  Anna Werner is here for acute visit.  She has been having headaches and notes her BP has been running 150's and 160's at work  NO chest pain.  No edema  .  She states her pulse rate is in the 50's    She took a few  Leftover clonidine that she had at home  to try to bring her BP down  (on clonidine for a few weeks [facility]) .  She has trouble with inosmnia and night time menopausal flushing.  She had been trying to use [place]   She tells me she is not drinking any ETOH and has been sober.    No Known Allergies Past Medical History  Diagnosis Date  . Anxiety   . Depression   . Arthritis   . Hypertension   . HSV-2 infection    Past Surgical History  Procedure Laterality Date  . Abdominal hysterectomy    . Hernia repair    . Breast lumpectomy     History   Social History  . Marital Status: Divorced    Spouse Name: N/A    Number of Children: N/A  . Years of Education: N/A   Occupational History  . Not on file.   Social History Main Topics  . Smoking status: Former Smoker    Types: Cigarettes  . Smokeless tobacco: Not on file  . Alcohol Use: No  . Drug Use: No  . Sexual Activity: Yes   Other Topics Concern  . Not on file   Social History Narrative   Regular exercise: no   Caffeine use: coffee daily   History reviewed. No pertinent family history. Patient Active Problem List   Diagnosis Date Noted  . Menopausal syndrome (hot flushes) 06/19/2013  . Lymphadenopathy 03/07/2013  . Sinus bradycardia 12/13/2012  . Breast density 09/07/2012  . Arthralgia 03/15/2012  . Hx of colonic polyps 01/30/2012  . S/P hysterectomy 01/30/2012  . History of anemia 01/30/2012  . Breast calcifications on mammogram 01/30/2012  . History of thyroid nodule 01/30/2012  . Anxiety 01/13/2012  . Depression 01/13/2012  . EtOH dependence 01/13/2012  .  Essential hypertension, benign 01/13/2012  . HSV-2 infection    Current Outpatient Prescriptions on File Prior to Visit  Medication Sig Dispense Refill  . ALPRAZolam (XANAX) 0.5 MG tablet TAKE 1 TABLET(S) BY MOUTH DAILY AS NEEDED FOR ANXIETY  20 tablet  0  . aspirin 81 MG tablet Take 81 mg by mouth daily.      . calcium carbonate 200 MG capsule Take 500 mg by mouth 2 (two) times daily with a meal.      . cholecalciferol (VITAMIN D) 1000 UNITS tablet Take 1,000 Units by mouth daily.      . clindamycin (CLINDAGEL) 1 % gel Apply topically 2 (two) times daily.  30 g  0  . fish oil-omega-3 fatty acids 1000 MG capsule Take 2 g by mouth daily.      . Lactobacillus (ACIDOPHILUS) TABS Take 1 tablet by mouth daily.      . mometasone (ELOCON) 0.1 % cream Apply topically daily.  45 g  0  . Multiple Vitamins-Minerals (MULTI VITAMIN/MINERALS PO) Take by mouth.      . nabumetone (RELAFEN) 500 MG tablet Take 1 tablet (500 mg total) by mouth 2 (two) times daily.  60 tablet  0  . Nutritional Supplements (ESTROVEN PO) Take 1 tablet by mouth daily.      . vitamin C (ASCORBIC ACID) 500 MG tablet Take 500 mg by mouth daily.      . Alfalfa 250 MG TABS Take 1 tablet by mouth daily.       No current facility-administered medications on file prior to visit.      Review of Systems    see HPI Objective:   Physical Exam Physical Exam  Nursing note and vitals reviewed.   EKG   Sinus bradycardia  Rate 55 Constitutional: She is oriented to person, place, and time. She appears well-developed and well-nourished.  HENT:  Head: Normocephalic and atraumatic.  Cardiovascular: Normal rate and regular rhythm. Exam reveals no gallop and no friction rub.  No murmur heard.  Pulmonary/Chest: Breath sounds normal. She has no wheezes. She has no rales.  Neurological: She is alert and oriented to person, place, and time.  Skin: Skin is warm and dry.  Ext no edema Psychiatric: She has a normal mood and affect. Her behavior  is normal.        Assessment & Plan:  HTN  Not controlled with stop lotrel and give Hyzaar 100/25 daily  See me next week.   If necessary we may add low dose norvasc.    Sinus bradycardia  Avoid beta blockade for now  Symptomatic menopause  Ok to try OTC genistein and cooling pillow     See me inone week

## 2013-06-19 NOTE — Patient Instructions (Addendum)
Stop your lotrel and benazepril .  Do not take clonidine   Take Hyzaar  100/25  ( this is a higher dose)  Every day  See me in one week - we may need to add a medication then

## 2013-06-21 LAB — COMPREHENSIVE METABOLIC PANEL
ALBUMIN: 4.7 g/dL (ref 3.5–5.2)
ALK PHOS: 71 U/L (ref 39–117)
ALT: 17 U/L (ref 0–35)
AST: 20 U/L (ref 0–37)
BILIRUBIN TOTAL: 0.4 mg/dL (ref 0.2–1.2)
BUN: 22 mg/dL (ref 6–23)
CO2: 31 mEq/L (ref 19–32)
Calcium: 9.6 mg/dL (ref 8.4–10.5)
Chloride: 99 mEq/L (ref 96–112)
Creat: 0.78 mg/dL (ref 0.50–1.10)
GLUCOSE: 84 mg/dL (ref 70–99)
POTASSIUM: 3.7 meq/L (ref 3.5–5.3)
Sodium: 139 mEq/L (ref 135–145)
Total Protein: 7.6 g/dL (ref 6.0–8.3)

## 2013-06-21 LAB — TSH: TSH: 0.647 u[IU]/mL (ref 0.350–4.500)

## 2013-06-25 ENCOUNTER — Encounter: Payer: Self-pay | Admitting: Internal Medicine

## 2013-06-25 ENCOUNTER — Encounter: Payer: Self-pay | Admitting: *Deleted

## 2013-06-25 ENCOUNTER — Ambulatory Visit (INDEPENDENT_AMBULATORY_CARE_PROVIDER_SITE_OTHER): Payer: PRIVATE HEALTH INSURANCE | Admitting: Internal Medicine

## 2013-06-25 VITALS — BP 111/74 | HR 89 | Temp 98.0°F | Resp 16 | Ht 63.0 in | Wt 148.0 lb

## 2013-06-25 DIAGNOSIS — R197 Diarrhea, unspecified: Secondary | ICD-10-CM

## 2013-06-25 DIAGNOSIS — K5289 Other specified noninfective gastroenteritis and colitis: Secondary | ICD-10-CM

## 2013-06-25 DIAGNOSIS — I10 Essential (primary) hypertension: Secondary | ICD-10-CM

## 2013-06-25 DIAGNOSIS — K529 Noninfective gastroenteritis and colitis, unspecified: Secondary | ICD-10-CM

## 2013-06-25 DIAGNOSIS — R509 Fever, unspecified: Secondary | ICD-10-CM

## 2013-06-25 NOTE — Progress Notes (Signed)
Patient ID: Anna JohnsJessie Werner, female   DOB: 04/17/1961, 53 y.o.   MRN: [id number] Rapid Influenza = Negative

## 2013-06-25 NOTE — Progress Notes (Signed)
Subjective:    Patient ID: Anna Werner, female    DOB: [birth year], 53 y.o.   MRN: [id number]  HPI  Acute visit.  Temp 101 at home.  Diarrhea last 2 days.  No N/V  No cough or sorethroat  No nasal congestion  HTN  Tolerating hyzaar great  No Known Allergies Past Medical History  Diagnosis Date  . Anxiety   . Depression   . Arthritis   . Hypertension   . HSV-2 infection    Past Surgical History  Procedure Laterality Date  . Abdominal hysterectomy    . Hernia repair    . Breast lumpectomy     History   Social History  . Marital Status: Divorced    Spouse Name: N/A    Number of Children: N/A  . Years of Education: N/A   Occupational History  . Not on file.   Social History Main Topics  . Smoking status: Former Smoker    Types: Cigarettes  . Smokeless tobacco: Not on file  . Alcohol Use: No  . Drug Use: No  . Sexual Activity: Yes   Other Topics Concern  . Not on file   Social History Narrative   Regular exercise: no   Caffeine use: coffee daily   No family history on file. Patient Active Problem List   Diagnosis Date Noted  . Menopausal syndrome (hot flushes) 06/19/2013  . Lymphadenopathy 03/07/2013  . Sinus bradycardia 12/13/2012  . Breast density 09/07/2012  . Arthralgia 03/15/2012  . Hx of colonic polyps 01/30/2012  . S/P hysterectomy 01/30/2012  . History of anemia 01/30/2012  . Breast calcifications on mammogram 01/30/2012  . History of thyroid nodule 01/30/2012  . Anxiety 01/13/2012  . Depression 01/13/2012  . EtOH dependence 01/13/2012  . Essential hypertension, benign 01/13/2012  . HSV-2 infection    Current Outpatient Prescriptions on File Prior to Visit  Medication Sig Dispense Refill  . Alfalfa 250 MG TABS Take 1 tablet by mouth daily.      Marland Kitchen ALPRAZolam (XANAX) 0.5 MG tablet TAKE 1 TABLET(S) BY MOUTH DAILY AS NEEDED FOR ANXIETY  20 tablet  0  . aspirin 81 MG tablet Take 81 mg by mouth daily.      . calcium carbonate 200 MG capsule  Take 500 mg by mouth 2 (two) times daily with a meal.      . cetirizine (ZYRTEC) 10 MG tablet Take 10 mg by mouth daily.      . cholecalciferol (VITAMIN D) 1000 UNITS tablet Take 1,000 Units by mouth daily.      . clindamycin (CLINDAGEL) 1 % gel Apply topically 2 (two) times daily.  30 g  0  . fish oil-omega-3 fatty acids 1000 MG capsule Take 2 g by mouth daily.      . Lactobacillus (ACIDOPHILUS) TABS Take 1 tablet by mouth daily.      Marland Kitchen losartan-hydrochlorothiazide (HYZAAR) 100-25 MG per tablet Take 1 tablet by mouth daily.  30 tablet  0  . mometasone (ELOCON) 0.1 % cream Apply topically daily.  45 g  0  . Multiple Vitamins-Minerals (MULTI VITAMIN/MINERALS PO) Take by mouth.      . nabumetone (RELAFEN) 500 MG tablet Take 1 tablet (500 mg total) by mouth 2 (two) times daily.  60 tablet  0  . Nutritional Supplements (ESTROVEN PO) Take 1 tablet by mouth daily.      . ranitidine (ZANTAC) 150 MG tablet Take 150 mg by mouth 2 (two) times daily.      Marland Kitchen  vitamin C (ASCORBIC ACID) 500 MG tablet Take 500 mg by mouth daily.       No current facility-administered medications on file prior to visit.      Review of Systems See HPI    Objective:   Physical Exam  Physical Exam  Nursing note and vitals reviewed.  Constitutional: She is oriented to person, place, and time. She appears well-developed and well-nourished.  HENT:  Head: Normocephalic and atraumatic.  Cardiovascular: Normal rate and regular rhythm. Exam reveals no gallop and no friction rub.  No murmur heard.  Pulmonary/Chest: Breath sounds normal. She has no wheezes. She has no rales.  Neurological: She is alert and oriented to person, place, and time.  Skin: Skin is warm and dry.  Psychiatric: She has a normal mood and affect. Her behavior is normal.             Assessment & Plan:  Diarrhea    Gastrenteritis  Avoid dairy  Increase fluids as tolerated  HTN  contineu new med hyzaar

## 2013-06-28 ENCOUNTER — Ambulatory Visit: Payer: PRIVATE HEALTH INSURANCE | Admitting: Internal Medicine

## 2013-06-28 DIAGNOSIS — Z09 Encounter for follow-up examination after completed treatment for conditions other than malignant neoplasm: Secondary | ICD-10-CM

## 2013-07-11 ENCOUNTER — Other Ambulatory Visit: Payer: Self-pay | Admitting: Internal Medicine

## 2013-07-11 NOTE — Telephone Encounter (Signed)
Refill request

## 2013-07-16 ENCOUNTER — Other Ambulatory Visit: Payer: Self-pay | Admitting: *Deleted

## 2013-07-16 ENCOUNTER — Telehealth: Payer: Self-pay | Admitting: *Deleted

## 2013-07-16 MED ORDER — LOSARTAN POTASSIUM-HCTZ 100-25 MG PO TABS: 1.0000 | ORAL_TABLET | Freq: Every day | ORAL | Status: DC

## 2013-07-16 NOTE — Telephone Encounter (Signed)
[facility] [address]  [facility]

## 2013-07-16 NOTE — Telephone Encounter (Signed)
Refill request last [facility] 2/15

## 2013-07-17 ENCOUNTER — Telehealth: Payer: Self-pay | Admitting: *Deleted

## 2013-07-17 NOTE — Telephone Encounter (Signed)
Returned pt call no answer LVM message to return call

## 2013-08-16 ENCOUNTER — Encounter: Payer: Self-pay | Admitting: *Deleted

## 2013-08-30 ENCOUNTER — Other Ambulatory Visit: Payer: Self-pay | Admitting: Internal Medicine

## 2013-08-30 DIAGNOSIS — F419 Anxiety disorder, unspecified: Secondary | ICD-10-CM

## 2013-08-30 NOTE — Telephone Encounter (Signed)
Refill request. Will call in pending approval 

## 2013-09-03 MED ORDER — ALPRAZOLAM 0.5 MG PO TABS: ORAL_TABLET | ORAL | Status: DC

## 2013-09-03 MED ORDER — CLINDAMYCIN PHOSPHATE 1 % EX GEL: Freq: Two times a day (BID) | CUTANEOUS | Status: DC

## 2013-09-03 MED ORDER — MOMETASONE FUROATE 0.1 % EX CREA: TOPICAL_CREAM | Freq: Every day | CUTANEOUS | Status: DC

## 2013-09-03 NOTE — Telephone Encounter (Signed)
[facility]

## 2013-11-20 ENCOUNTER — Ambulatory Visit (INDEPENDENT_AMBULATORY_CARE_PROVIDER_SITE_OTHER): Payer: PRIVATE HEALTH INSURANCE | Admitting: Internal Medicine

## 2013-11-20 ENCOUNTER — Encounter: Payer: Self-pay | Admitting: Internal Medicine

## 2013-11-20 VITALS — BP 137/78 | HR 62 | Resp 16 | Ht 63.0 in | Wt 156.0 lb

## 2013-11-20 DIAGNOSIS — S39012A Strain of muscle, fascia and tendon of lower back, initial encounter: Secondary | ICD-10-CM

## 2013-11-20 DIAGNOSIS — S335XXA Sprain of ligaments of lumbar spine, initial encounter: Secondary | ICD-10-CM

## 2013-11-20 MED ORDER — CYCLOBENZAPRINE HCL 10 MG PO TABS: ORAL_TABLET | ORAL | Status: DC

## 2013-11-20 MED ORDER — NABUMETONE 500 MG PO TABS: 500.0000 mg | ORAL_TABLET | Freq: Two times a day (BID) | ORAL | Status: DC

## 2013-11-20 NOTE — Progress Notes (Signed)
   Subjective:    Patient ID: Anna Werner, female    DOB: 1961-04-03, 53 y.o.   MRN: [id number]  HPI  Anna Werner is here for acute visit  She has been having lower back pain that sometimes "stabs and catches" with certain movements.    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  No radiation of pain  No Le numbness   No dysuria or flank pain   She is also concerned about weight gain    Review of Systems    see HPI Objective:   Physical Exam  Physical Exam  Nursing note and vitals reviewed.  Constitutional: She is oriented to person, place, and time. She appears well-developed and well-nourished.  HENT:  Head: Normocephalic and atraumatic.  Cardiovascular: Normal rate and regular rhythm. Exam reveals no gallop and no friction rub.  No murmur heard.  Pulmonary/Chest: Breath sounds normal. She has no wheezes. She has no rales.  Neurological: She is alert and oriented to person, place, and time.  Skin: Skin is warm and dry.  Psychiatric: She has a normal mood and affect. Her behavior is normal.             Assessment & Plan:  Muscular back strain  OK for flexeril and relafen  Will refer to PT  Overweight  Gave referral to [facility] to wellness and diet plan  See me in 3-4 weeks

## 2013-11-27 ENCOUNTER — Ambulatory Visit: Payer: PRIVATE HEALTH INSURANCE | Attending: Internal Medicine | Admitting: Rehabilitation

## 2013-11-27 DIAGNOSIS — M545 Low back pain, unspecified: Secondary | ICD-10-CM | POA: Diagnosis not present

## 2013-11-27 DIAGNOSIS — M19019 Primary osteoarthritis, unspecified shoulder: Secondary | ICD-10-CM | POA: Diagnosis not present

## 2013-11-27 DIAGNOSIS — M1710 Unilateral primary osteoarthritis, unspecified knee: Secondary | ICD-10-CM | POA: Diagnosis not present

## 2013-11-27 DIAGNOSIS — S335XXA Sprain of ligaments of lumbar spine, initial encounter: Secondary | ICD-10-CM | POA: Diagnosis not present

## 2013-11-27 DIAGNOSIS — I10 Essential (primary) hypertension: Secondary | ICD-10-CM | POA: Insufficient documentation

## 2013-11-27 DIAGNOSIS — IMO0001 Reserved for inherently not codable concepts without codable children: Secondary | ICD-10-CM | POA: Diagnosis not present

## 2013-11-27 DIAGNOSIS — X58XXXA Exposure to other specified factors, initial encounter: Secondary | ICD-10-CM | POA: Diagnosis not present

## 2013-11-29 ENCOUNTER — Ambulatory Visit: Payer: PRIVATE HEALTH INSURANCE | Admitting: Rehabilitation

## 2013-11-29 DIAGNOSIS — IMO0001 Reserved for inherently not codable concepts without codable children: Secondary | ICD-10-CM | POA: Diagnosis not present

## 2013-12-02 ENCOUNTER — Encounter: Payer: Self-pay | Admitting: Internal Medicine

## 2013-12-03 ENCOUNTER — Other Ambulatory Visit: Payer: Self-pay | Admitting: *Deleted

## 2013-12-05 ENCOUNTER — Ambulatory Visit: Payer: PRIVATE HEALTH INSURANCE | Admitting: Rehabilitation

## 2013-12-12 ENCOUNTER — Encounter: Payer: Self-pay | Admitting: Internal Medicine

## 2013-12-12 ENCOUNTER — Ambulatory Visit (INDEPENDENT_AMBULATORY_CARE_PROVIDER_SITE_OTHER): Payer: PRIVATE HEALTH INSURANCE | Admitting: Internal Medicine

## 2013-12-12 VITALS — BP 135/80 | HR 68 | Temp 98.0°F | Resp 16 | Ht 63.0 in | Wt 155.8 lb

## 2013-12-12 DIAGNOSIS — M5416 Radiculopathy, lumbar region: Secondary | ICD-10-CM

## 2013-12-12 DIAGNOSIS — N951 Menopausal and female climacteric states: Secondary | ICD-10-CM

## 2013-12-12 DIAGNOSIS — IMO0002 Reserved for concepts with insufficient information to code with codable children: Secondary | ICD-10-CM

## 2013-12-12 NOTE — Progress Notes (Signed)
Subjective:    Patient ID: Anna Werner, female    DOB: Aug 11, 1960, 53 y.o.   MRN: [id number]  HPI Anna Werner is here for follow up  .   She has tried her Flexeril and .relafen but still has pain radiating down her inner left thigh.    She has had PT but pain persists.    She has refrained from lifting at the NH  Perimenopausal flushes:   She is using OTC Estroven   And this helps   No Known Allergies Past Medical History  Diagnosis Date  . Anxiety   . Depression   . Arthritis   . Hypertension   . HSV-2 infection    Past Surgical History  Procedure Laterality Date  . Abdominal hysterectomy    . Hernia repair    . Breast lumpectomy     History   Social History  . Marital Status: Divorced    Spouse Name: N/A    Number of Children: N/A  . Years of Education: N/A   Occupational History  . Not on file.   Social History Main Topics  . Smoking status: Former Smoker    Types: Cigarettes  . Smokeless tobacco: Not on file  . Alcohol Use: No  . Drug Use: No  . Sexual Activity: Yes   Other Topics Concern  . Not on file   Social History Narrative   Regular exercise: no   Caffeine use: coffee daily   History reviewed. No pertinent family history. Patient Active Problem List   Diagnosis Date Noted  . Low back strain 11/20/2013  . Menopausal syndrome (hot flushes) 06/19/2013  . Lymphadenopathy 03/07/2013  . Sinus bradycardia 12/13/2012  . Breast density 09/07/2012  . Arthralgia 03/15/2012  . Hx of colonic polyps 01/30/2012  . S/P hysterectomy 01/30/2012  . History of anemia 01/30/2012  . Breast calcifications on mammogram 01/30/2012  . History of thyroid nodule 01/30/2012  . Anxiety 01/13/2012  . Depression 01/13/2012  . EtOH dependence 01/13/2012  . Essential hypertension, benign 01/13/2012  . HSV-2 infection    Current Outpatient Prescriptions on File Prior to Visit  Medication Sig Dispense Refill  . Alfalfa 250 MG TABS Take 1 tablet by mouth daily.      Marland Kitchen.  ALPRAZolam (XANAX) 0.5 MG tablet TAKE 1 TABLET(S) BY MOUTH DAILY AS NEEDED FOR ANXIETY  20 tablet  0  . aspirin 81 MG tablet Take 81 mg by mouth daily.      . calcium carbonate 200 MG capsule Take 500 mg by mouth 2 (two) times daily with a meal.      . cetirizine (ZYRTEC) 10 MG tablet Take 10 mg by mouth daily.      . cholecalciferol (VITAMIN D) 1000 UNITS tablet Take 1,000 Units by mouth daily.      . clindamycin (CLINDAGEL) 1 % gel Apply topically 2 (two) times daily.  30 g  0  . cyclobenzaprine (FLEXERIL) 10 MG tablet Take one at bedtime and 1/2 tablet during the day  30 tablet  1  . fish oil-omega-3 fatty acids 1000 MG capsule Take 2 g by mouth daily.      . Lactobacillus (ACIDOPHILUS) TABS Take 1 tablet by mouth daily.      Marland Kitchen. losartan-hydrochlorothiazide (HYZAAR) 100-25 MG per tablet Take 1 tablet by mouth daily.  90 tablet  1  . mometasone (ELOCON) 0.1 % cream Apply topically daily.  45 g  0  . Multiple Vitamins-Minerals (MULTI VITAMIN/MINERALS PO) Take by mouth.      .Marland Kitchen  nabumetone (RELAFEN) 500 MG tablet Take 1 tablet (500 mg total) by mouth 2 (two) times daily.  60 tablet  0  . Nutritional Supplements (ESTROVEN PO) Take 1 tablet by mouth daily.      . ranitidine (ZANTAC) 150 MG tablet Take 150 mg by mouth 2 (two) times daily.      . vitamin C (ASCORBIC ACID) 500 MG tablet Take 500 mg by mouth daily.       No current facility-administered medications on file prior to visit.       Review of Systems See HPI    Objective:   Physical Exam Physical Exam  Nursing note and vitals reviewed.  Constitutional: She is oriented to person, place, and time. She appears well-developed and well-nourished.  HENT:  Head: Normocephalic and atraumatic.  Cardiovascular: Normal rate and regular rhythm. Exam reveals no gallop and no friction rub.  No murmur heard.  Pulmonary/Chest: Breath sounds normal. She has no wheezes. She has no rales.  Neurological: She is alert and oriented to person, place,  and time. LE  Reflexes  2+ symmetric Motor 5/5 LE  sensory  Intact to microfilament SLR pos on left   Skin: Skin is warm and dry.  Psychiatric: She has a normal mood and affect. Her behavior is normal.         Assessment & Plan:  Lumbar radiculopathy  No improvement with mm relaxants,  NSAIDS and PT  Will get MRI  Further management based on result  permenopausal flushes   OK to OTC [employer]

## 2013-12-12 NOTE — Patient Instructions (Signed)
Will schedule MRI   Call me 2 days after xray is done

## 2013-12-22 ENCOUNTER — Ambulatory Visit (HOSPITAL_BASED_OUTPATIENT_CLINIC_OR_DEPARTMENT_OTHER)
Admission: RE | Admit: 2013-12-22 | Discharge: 2013-12-22 | Disposition: A | Discharge status: Home / Self Care | Payer: PRIVATE HEALTH INSURANCE | Source: Ambulatory Visit | Attending: Internal Medicine | Admitting: Internal Medicine

## 2013-12-22 DIAGNOSIS — M545 Low back pain, unspecified: Secondary | ICD-10-CM | POA: Diagnosis present

## 2013-12-22 DIAGNOSIS — M5416 Radiculopathy, lumbar region: Secondary | ICD-10-CM

## 2013-12-22 DIAGNOSIS — M47817 Spondylosis without myelopathy or radiculopathy, lumbosacral region: Secondary | ICD-10-CM | POA: Diagnosis not present

## 2013-12-22 DIAGNOSIS — M6281 Muscle weakness (generalized): Secondary | ICD-10-CM | POA: Diagnosis not present

## 2013-12-23 ENCOUNTER — Encounter: Payer: Self-pay | Admitting: Internal Medicine

## 2014-01-15 ENCOUNTER — Ambulatory Visit (INDEPENDENT_AMBULATORY_CARE_PROVIDER_SITE_OTHER): Payer: PRIVATE HEALTH INSURANCE | Admitting: Internal Medicine

## 2014-01-15 ENCOUNTER — Encounter: Payer: Self-pay | Admitting: Internal Medicine

## 2014-01-15 ENCOUNTER — Other Ambulatory Visit: Payer: Self-pay | Admitting: Internal Medicine

## 2014-01-15 VITALS — BP 128/80 | HR 76 | Temp 97.6°F | Resp 12 | Wt 158.0 lb

## 2014-01-15 DIAGNOSIS — Z862 Personal history of diseases of the blood and blood-forming organs and certain disorders involving the immune mechanism: Secondary | ICD-10-CM

## 2014-01-15 DIAGNOSIS — Z8639 Personal history of other endocrine, nutritional and metabolic disease: Secondary | ICD-10-CM

## 2014-01-15 NOTE — Patient Instructions (Signed)
Please stop at the lab. You will be called with the Thyroid Ultrasound schedule. Please return in 1 year.

## 2014-01-15 NOTE — Progress Notes (Addendum)
Patient ID: Anna Werner, female   DOB: 1961/03/19, 53 y.o.   MRN: [id number]   HPI  Anna Werner is a 53 y.o.-year-old female, returning for f/u for MNG. Last visit 1 year ago.  She was found to have 1 thyroid nodule in 2012, but had multiple nodules on thyroid U/S in 12/2012: Two nodules are larger, 1 cm diameter each, mixed, without internal calcifications but with internal blood flow - 1 in left and 1 in right lobe   An uptake and scan (02/2013): Heterogeneous thyroid uptake with small warm nodule on the right and possible small cold nodule on the left.  After last visit, I discussed with the patient about the above results, suggesting that, since the right thyroid nodule is" warm", we should not worry about it since the risk of cancer is almost 0. However, on the left, there is a cold nodule, probably corresponding to the 1 x 0.9 cm nodule seen on ultrasound. We decided to continue to follow this nodule more closely.  Pt denies feeling nodules in neck, hoarseness, dysphagia/odynophagia, SOB with lying down, has a vague discomfort in right side of neck.  I reviewed pt's thyroid tests: Lab Results  Component Value Date   TSH 0.647 06/19/2013   TSH 0.958 12/13/2012   TSH 1.037 01/12/2012    Pt c/o: - + heat intolerance = hot flushes - no tremors  - no anxiety/depression - no palpitations - no fatigue - no diarrhea/constipation - no weight gain - no dry skin  I reviewed pt's medications, allergies, PMH, social hx, family hx and no changes required, except as mentioned above.  ROS: Constitutional: see HPI, + poor sleep Eyes: no blurry vision, no xerophthalmia ENT: no sore throat, no nodules palpated in throat, no dysphagia/odynophagia, no hoarseness Cardiovascular: no CP/SOB/palpitations/leg swelling Respiratory: no cough/SOB Gastrointestinal: no N/V/D/C Musculoskeletal: + both: muscle/joint aches Skin: no rashes Neurological: no tremors/numbness/tingling/dizziness  PE: BP  128/80  Pulse 76  Temp(Src) 97.6 F (36.4 C) (Oral)  Resp 12  Wt 158 lb (71.668 kg)  SpO2 99% Wt Readings from Last 3 Encounters:  01/15/14 158 lb (71.668 kg)  12/12/13 155 lb 12.8 oz (70.67 kg)  11/20/13 156 lb (70.761 kg)   Constitutional: slightly overweight, in NAD Eyes: PERRLA, EOMI, no exophthalmos ENT: moist mucous membranes, + slight thyromegaly - lumpy-bumpy at palpation, no cervical lymphadenopathy Cardiovascular: RRR, No MRG Respiratory: CTA B Gastrointestinal: abdomen soft, NT, ND, BS+ Musculoskeletal: no deformities, strength intact in all 4;  Skin: moist, warm, no rashes Neurological: no tremor with outstretched hands, DTR normal in all 4  ASSESSMENT: 1. MNG - thyroid U/S (12/13/2012):  Right thyroid lobe: Measures 7.5 x 1.9 x 1.9 cm   Left thyroid lobe: Measures 6.8 x 1.7 by 1.6 cm.   Isthmus: Measures 3 mm thickness.   Focal nodules: A nodule in the mid to superior aspect of the right lobe measures 1 x 0.5 x 0.7 cm. At least three additional smaller nodules are noted in the right lobe. A heterogeneous nodule inferior aspect of the left lobe measures 9 x 10 mm. A tiny nodule is noted in the mid aspect of the left lobe.   Lymphadenopathy: None visualized.  - 02/07/2014: thyroid Scan: Heterogeneous thyroid uptake with small warm nodule on the right and possible small cold nodule on the left.  PLAN: 1. MNG  - we eviewed the results of the thyroid ultrasound and the uptake and scan along with the pt. She has 2 dominant 1 cm  nodules, one in each lobes. Per the Uptake and scan, the R nodule is warm and the L nodule is cold. We discussed that we will likely need another thyroid U/S and, if the L nodule has grown, I would suggest a Bx. The warm nodule has a very low risk of cancer, so no Bx needed. - she agrees with the plan - I will recheck her TFTs today and, if normal, she can have yearly checks from now on - I'll see her back in a year - I will send her the  results through MyChart  Component     Latest Ref Rng 01/15/2014  TSH     0.35 - 4.50 uIU/mL 0.26 (L)  T3, Free     2.3 - 4.2 pg/mL 2.4  Free T4     0.60 - 1.60 ng/dL 1.61  TSH a little low >> we only need to follow this for now. Will repeat TFTs in 3 months. Thyroid U/S pending. I will addend the results when they become available.  CLINICAL DATA: 53 year old female follow-up ultrasound thyroid  EXAM: THYROID ULTRASOUND  TECHNIQUE: Ultrasound examination of the thyroid gland and adjacent soft tissues was performed.  COMPARISON: CT neck 03/08/2013, prior thyroid ultrasound 12/13/2012  FINDINGS: Right thyroid lobe  Measurements: 6.9 cm, 1.7 cm, 1.9 cm. Four separate lesions of the right thyroid lobe, with relatively heterogeneous echotexture of the intervening parenchyma. Dominant lesion within the right mid thyroid lobe measuring 1.1 cm x 5 mm x 9 mm. More superiorly there is a lesion measuring 5 mm x 2 mm. Medially solid lesion measures 5 mm x 3 mm x 4 mm. At the inferior aspect a lesion measures 6 mm x 6 mm x 7 mm.  Left thyroid lobe  Measurements: 6.1 cm x 1.6 cm x 1.8 cm. Dominant nodule the left thyroid lobe measures 1.5 cm x 1.0 cm x 1.1 cm. A lesion more medially measures 9 mm x 5 mm x 6 mm. The intervening parenchyma is relatively homogeneous in echotexture.  Isthmus  Thickness: 3-4 mm. No nodules visualized.  Lymphadenopathy  None visualized.  IMPRESSION: Multiple bilateral thyroid nodules. The dominant lesion is on the left and measures 1.5 cm. Findings meet consensus criteria for biopsy. Ultrasound-guided fine needle aspiration should be considered, as per the consensus statement: Management of Thyroid Nodules Detected at [place]: Society of Radiologists in Ultrasound Consensus Conference Statement. Radiology 2005; [id number].  Signed,  Yvone Neu. Loreta Ave, DO  [facility]  [facility]   Electronically  Signed By: Gilmer Mor O.D. On: 01/28/2014 16:57  I will suggest Bx-ing the 1.4 cm L nodule.  Adequacy Reason Satisfactory For Evaluation. Diagnosis THYROID, FINE NEEDLE ASPIRATION LEFT LOWER LOBE (SPECIMEN 1 OF 1 COLLECTED 02/05/2014) BENIGN. FINDINGS CONSISTENT WITH NON-NEOPLASTIC GOITER AND CONSISTENT WITH THE CONTENTS OF A CYST. [place] RUND DO Pathologist, Electronic Signature (Case signed 02/07/2014) Specimen Clinical Information 1.5 x 0 x 1. 1cm left lower dominant lesion of thyroid, Follow-up ultrasound thyroid Source Thyroid, Fine Needle Aspiration, Left Lower Lobe, (Specimen 1 of 1, collected on 02/05/14 )  No malignancy. Will keep an eye on the nodules, but NTD for now. Will repeat TFTs in 3 months.

## 2014-01-16 LAB — TSH: TSH: 0.26 u[IU]/mL — AB (ref 0.35–4.50)

## 2014-01-16 LAB — T4, FREE: Free T4: 0.83 ng/dL (ref 0.60–1.60)

## 2014-01-16 LAB — T3, FREE: T3, Free: 2.4 pg/mL (ref 2.3–4.2)

## 2014-01-17 ENCOUNTER — Telehealth: Payer: Self-pay | Admitting: Internal Medicine

## 2014-01-17 ENCOUNTER — Other Ambulatory Visit: Payer: Self-pay | Admitting: Internal Medicine

## 2014-01-17 DIAGNOSIS — F419 Anxiety disorder, unspecified: Secondary | ICD-10-CM

## 2014-01-17 MED ORDER — ALPRAZOLAM 0.5 MG PO TABS: ORAL_TABLET | ORAL | Status: DC

## 2014-01-17 MED ORDER — CLINDAMYCIN PHOSPHATE 1 % EX GEL: Freq: Two times a day (BID) | CUTANEOUS | Status: DC

## 2014-01-17 NOTE — Telephone Encounter (Signed)
RX called in for Xanax and Clindamycin. She is aware-eh

## 2014-01-21 ENCOUNTER — Other Ambulatory Visit: Payer: Self-pay | Admitting: Internal Medicine

## 2014-01-21 DIAGNOSIS — F419 Anxiety disorder, unspecified: Secondary | ICD-10-CM

## 2014-01-28 ENCOUNTER — Ambulatory Visit
Admission: RE | Admit: 2014-01-28 | Discharge: 2014-01-28 | Disposition: A | Discharge status: Home / Self Care | Payer: PRIVATE HEALTH INSURANCE | Source: Ambulatory Visit | Attending: Internal Medicine | Admitting: Internal Medicine

## 2014-01-28 NOTE — Addendum Note (Signed)
Addended by: Carlus Pavlov on: 01/28/2014 05:44 PM   Modules accepted: Orders

## 2014-02-05 ENCOUNTER — Ambulatory Visit
Admission: RE | Admit: 2014-02-05 | Discharge: 2014-02-05 | Disposition: A | Discharge status: Home / Self Care | Payer: PRIVATE HEALTH INSURANCE | Source: Ambulatory Visit | Attending: Internal Medicine | Admitting: Internal Medicine

## 2014-02-05 ENCOUNTER — Other Ambulatory Visit (HOSPITAL_COMMUNITY)
Admission: RE | Admit: 2014-02-05 | Discharge: 2014-02-05 | Disposition: A | Discharge status: Home / Self Care | Payer: PRIVATE HEALTH INSURANCE | Source: Ambulatory Visit | Attending: Interventional Radiology | Admitting: Interventional Radiology

## 2014-02-05 DIAGNOSIS — E041 Nontoxic single thyroid nodule: Secondary | ICD-10-CM | POA: Insufficient documentation

## 2014-03-11 ENCOUNTER — Encounter: Payer: Self-pay | Admitting: Internal Medicine

## 2014-03-19 ENCOUNTER — Other Ambulatory Visit: Payer: Self-pay | Admitting: *Deleted

## 2014-03-19 DIAGNOSIS — Z0000 Encounter for general adult medical examination without abnormal findings: Secondary | ICD-10-CM

## 2014-03-20 ENCOUNTER — Encounter: Payer: Self-pay | Admitting: Internal Medicine

## 2014-03-20 ENCOUNTER — Ambulatory Visit (INDEPENDENT_AMBULATORY_CARE_PROVIDER_SITE_OTHER): Payer: PRIVATE HEALTH INSURANCE | Admitting: Internal Medicine

## 2014-03-20 VITALS — BP 130/77 | HR 70 | Temp 98.1°F | Resp 16 | Ht 62.5 in | Wt 160.0 lb

## 2014-03-20 DIAGNOSIS — I10 Essential (primary) hypertension: Secondary | ICD-10-CM

## 2014-03-20 DIAGNOSIS — Z0189 Encounter for other specified special examinations: Secondary | ICD-10-CM

## 2014-03-20 DIAGNOSIS — F329 Major depressive disorder, single episode, unspecified: Secondary | ICD-10-CM

## 2014-03-20 DIAGNOSIS — R928 Other abnormal and inconclusive findings on diagnostic imaging of breast: Secondary | ICD-10-CM

## 2014-03-20 DIAGNOSIS — R921 Mammographic calcification found on diagnostic imaging of breast: Secondary | ICD-10-CM

## 2014-03-20 DIAGNOSIS — F32A Depression, unspecified: Secondary | ICD-10-CM

## 2014-03-20 DIAGNOSIS — Z0000 Encounter for general adult medical examination without abnormal findings: Secondary | ICD-10-CM

## 2014-03-20 DIAGNOSIS — F419 Anxiety disorder, unspecified: Secondary | ICD-10-CM

## 2014-03-20 LAB — COMPLETE METABOLIC PANEL WITH GFR
ALK PHOS: 58 U/L (ref 39–117)
ALT: 11 U/L (ref 0–35)
AST: 16 U/L (ref 0–37)
Albumin: 4 g/dL (ref 3.5–5.2)
BILIRUBIN TOTAL: 0.2 mg/dL (ref 0.2–1.2)
BUN: 15 mg/dL (ref 6–23)
CO2: 28 mEq/L (ref 19–32)
CREATININE: 0.89 mg/dL (ref 0.50–1.10)
Calcium: 9.6 mg/dL (ref 8.4–10.5)
Chloride: 100 mEq/L (ref 96–112)
GFR, Est African American: 87 mL/min
GFR, Est Non African American: 75 mL/min
Glucose, Bld: 94 mg/dL (ref 70–99)
Potassium: 3.8 mEq/L (ref 3.5–5.3)
SODIUM: 137 meq/L (ref 135–145)
TOTAL PROTEIN: 6.8 g/dL (ref 6.0–8.3)

## 2014-03-20 LAB — CBC WITH DIFFERENTIAL/PLATELET
Basophils Absolute: 0.1 10*3/uL (ref 0.0–0.1)
Basophils Relative: 1 % (ref 0–1)
Eosinophils Absolute: 0.1 10*3/uL (ref 0.0–0.7)
Eosinophils Relative: 1 % (ref 0–5)
HCT: 36.9 % (ref 36.0–46.0)
HEMOGLOBIN: 12 g/dL (ref 12.0–15.0)
LYMPHS ABS: 3.4 10*3/uL (ref 0.7–4.0)
LYMPHS PCT: 43 % (ref 12–46)
MCH: 25.1 pg — ABNORMAL LOW (ref 26.0–34.0)
MCHC: 32.5 g/dL (ref 30.0–36.0)
MCV: 77 fL — ABNORMAL LOW (ref 78.0–100.0)
MONOS PCT: 8 % (ref 3–12)
Monocytes Absolute: 0.6 10*3/uL (ref 0.1–1.0)
NEUTROS PCT: 47 % (ref 43–77)
Neutro Abs: 3.7 10*3/uL (ref 1.7–7.7)
PLATELETS: 305 10*3/uL (ref 150–400)
RBC: 4.79 MIL/uL (ref 3.87–5.11)
RDW: 14.9 % (ref 11.5–15.5)
WBC: 7.8 10*3/uL (ref 4.0–10.5)

## 2014-03-20 LAB — POCT URINALYSIS DIPSTICK
Bilirubin, UA: NEGATIVE
Glucose, UA: NEGATIVE
Ketones, UA: NEGATIVE
Leukocytes, UA: NEGATIVE
Nitrite, UA: NEGATIVE
Protein, UA: NEGATIVE
RBC UA: NEGATIVE
SPEC GRAV UA: 1.015
UROBILINOGEN UA: NEGATIVE
pH, UA: 6.5

## 2014-03-20 LAB — LIPID PANEL
Cholesterol: 186 mg/dL (ref 0–200)
HDL: 73 mg/dL (ref >39)
LDL Cholesterol: 86 mg/dL (ref 0–99)
TRIGLYCERIDES: 135 mg/dL (ref <150)
Total CHOL/HDL Ratio: 2.5 Ratio
VLDL: 27 mg/dL (ref 0–40)

## 2014-03-20 NOTE — Progress Notes (Signed)
Subjective:    Patient ID: Anna Werner, female    DOB: 22-Feb-1961, 53 y.o.   MRN: [id number]  HPI 01/2014 endocrine note with pathology I will suggest Bx-ing the 1.4 cm L nodule.  Adequacy Reason Satisfactory For Evaluation. Diagnosis THYROID, FINE NEEDLE ASPIRATION LEFT LOWER LOBE (SPECIMEN 1 OF 1 COLLECTED 02/05/2014) BENIGN. FINDINGS CONSISTENT WITH NON-NEOPLASTIC GOITER AND CONSISTENT WITH THE CONTENTS OF A CYST. [place] RUND DO Pathologist, Electronic Signature (Case signed 02/07/2014) Specimen Clinical Information 1.5 x 0 x 1. 1cm left lower dominant lesion of thyroid, Follow-up ultrasound thyroid Source Thyroid, Fine Needle Aspiration, Left Lower Lobe, (Specimen 1 of 1, collected on 02/05/14 )  No malignancy. Will keep an eye on the nodules, but NTD for now. Will repeat TFTs in 3 months.         Revision History     06/2013 note HTN Not controlled with stop lotrel and give Hyzaar 100/25 daily See me next week. If necessary we may add low dose norvasc.   Sinus bradycardia Avoid beta blockade for now  Symptomatic menopause Ok to try OTC genistein and cooling pillow    See me inone week  Today's note  Brayton Caves[facility]ie is here for CPE  HM: mm done 07/2013  Had colonoscopy Dr. Marcelene ButteHurrelbrink 2013,  Stopped smoking in 1996   S/P hysterectomy  MNG with TSH oversupression  BX neg for malignancy   She is getting massage for her back.     No Known Allergies Past Medical History  Diagnosis Date  . Anxiety   . Depression   . Arthritis   . Hypertension   . HSV-2 infection    Past Surgical History  Procedure Laterality Date  . Abdominal hysterectomy    . Hernia repair    . Breast lumpectomy     History   Social History  . Marital Status: Divorced    Spouse Name: N/A    Number of Children: N/A  . Years of Education: N/A   Occupational History  . Not on file.   Social History Main Topics  . Smoking status: Former Smoker    Types: Cigarettes  .  Smokeless tobacco: Not on file  . Alcohol Use: No  . Drug Use: No  . Sexual Activity: Yes   Other Topics Concern  . Not on file   Social History Narrative   Regular exercise: no   Caffeine use: coffee daily   No family history on file. Patient Active Problem List   Diagnosis Date Noted  . Low back strain 11/20/2013  . Menopausal syndrome (hot flushes) 06/19/2013  . Lymphadenopathy 03/07/2013  . Sinus bradycardia 12/13/2012  . Breast density 09/07/2012  . Arthralgia 03/15/2012  . Hx of colonic polyps 01/30/2012  . S/P hysterectomy 01/30/2012  . History of anemia 01/30/2012  . Breast calcifications on mammogram 01/30/2012  . Multiple thyroid nodules 01/30/2012  . Anxiety 01/13/2012  . Depression 01/13/2012  . EtOH dependence 01/13/2012  . Essential hypertension, benign 01/13/2012  . HSV-2 infection    Current Outpatient Prescriptions on File Prior to Visit  Medication Sig Dispense Refill  . Alfalfa 250 MG TABS Take 1 tablet by mouth daily.    Marland Kitchen. ALPRAZolam (XANAX) 0.5 MG tablet TAKE 1 TABLET(S) BY MOUTH DAILY AS NEEDED FOR ANXIETY 20 tablet 0  . aspirin 81 MG tablet Take 81 mg by mouth daily.    . calcium carbonate 200 MG capsule Take 500 mg by mouth 2 (two) times daily with a meal.    .  cetirizine (ZYRTEC) 10 MG tablet Take 10 mg by mouth daily.    . cholecalciferol (VITAMIN D) 1000 UNITS tablet Take 1,000 Units by mouth daily.    . clindamycin (CLINDAGEL) 1 % gel Apply topically 2 (two) times daily. 30 g 0  . cyclobenzaprine (FLEXERIL) 10 MG tablet Take one at bedtime and 1/2 tablet during the day 30 tablet 1  . fish oil-omega-3 fatty acids 1000 MG capsule Take 2 g by mouth daily.    . Lactobacillus (ACIDOPHILUS) TABS Take 1 tablet by mouth daily.    Marland Kitchen losartan-hydrochlorothiazide (HYZAAR) 100-25 MG per tablet Take 1 tablet by mouth daily. 90 tablet 1  . mometasone (ELOCON) 0.1 % cream Apply topically daily. 45 g 0  . Multiple Vitamins-Minerals (MULTI VITAMIN/MINERALS PO)  Take by mouth.    . nabumetone (RELAFEN) 500 MG tablet Take 1 tablet (500 mg total) by mouth 2 (two) times daily. 60 tablet 0  . Nutritional Supplements (ESTROVEN PO) Take 1 tablet by mouth daily.    . ranitidine (ZANTAC) 150 MG tablet Take 150 mg by mouth 2 (two) times daily.    . vitamin C (ASCORBIC ACID) 500 MG tablet Take 500 mg by mouth daily.     No current facility-administered medications on file prior to visit.       Review of Systems     Objective:   Physical Exam Physical Exam  Nursing note and vitals reviewed.  Constitutional: She is oriented to person, place, and time. She appears well-developed and well-nourished.  HENT:  Head: Normocephalic and atraumatic.  Right Ear: Tympanic membrane and ear canal normal. No drainage. Tympanic membrane is not injected and not erythematous.  Left Ear: Tympanic membrane and ear canal normal. No drainage. Tympanic membrane is not injected and not erythematous.  Nose: Nose normal. Right sinus exhibits no maxillary sinus tenderness and no frontal sinus tenderness. Left sinus exhibits no maxillary sinus tenderness and no frontal sinus tenderness.  Mouth/Throat: Oropharynx is clear and moist. No oral lesions. No oropharyngeal exudate.  Eyes: Conjunctivae and EOM are normal. Pupils are equal, round, and reactive to light.  Neck: Normal range of motion. Neck supple. No JVD present. Carotid bruit is not present. No mass and no thyromegaly present.  Cardiovascular: Normal rate, regular rhythm, S1 normal, S2 normal and intact distal pulses. Exam reveals no gallop and no friction rub.  No murmur heard.  Pulses:  Carotid pulses are 2+ on the right side, and 2+ on the left side.  Dorsalis pedis pulses are 2+ on the right side, and 2+ on the left side.  No carotid bruit. No LE edema  Pulmonary/Chest: Breath sounds normal. She has no wheezes. She has no rales. She exhibits no tenderness.  Breast no discrete mass no nipple discharge no axillary  adenpathy bilaterally  Abdominal: Soft. Bowel sounds are normal. She exhibits no distension and no mass. There is no hepatosplenomegaly. There is no tenderness. There is no CVA tenderness.  Rectal pt declined Musculoskeletal: Normal range of motion.  No active synovitis to joints.  Lymphadenopathy:  She has no cervical adenopathy.  She has no axillary adenopathy.  Right: No inguinal and no supraclavicular adenopathy present.  Left: No inguinal and no supraclavicular adenopathy present.  Neurological: She is alert and oriented to person, place, and time. She has normal strength and normal reflexes. She displays no tremor. No cranial nerve deficit or sensory deficit. Coordination and gait normal.  Skin: Skin is warm and dry. No rash noted. No cyanosis.  Nails show no clubbing.  Psychiatric: She has a normal mood and affect. Her speech is normal and behavior is normal. Cognition and memory are normal.           Assessment & Plan:  HM;  UTD with mm S/P hysterectomy   Counseled lung ca screening pt stopped smoking in 1996  HTN continue meds  MNG  Followed by endocrine  See me as needed

## 2014-03-21 ENCOUNTER — Encounter: Payer: Self-pay | Admitting: *Deleted

## 2014-03-21 LAB — VITAMIN D 25 HYDROXY (VIT D DEFICIENCY, FRACTURES): Vit D, 25-Hydroxy: 47 ng/mL (ref 30–89)

## 2014-03-23 ENCOUNTER — Encounter: Payer: Self-pay | Admitting: Internal Medicine

## 2014-04-16 ENCOUNTER — Other Ambulatory Visit: Payer: Self-pay | Admitting: Internal Medicine

## 2014-04-17 ENCOUNTER — Other Ambulatory Visit: Payer: Self-pay | Admitting: *Deleted

## 2014-04-17 DIAGNOSIS — F419 Anxiety disorder, unspecified: Secondary | ICD-10-CM

## 2014-04-17 MED ORDER — ALPRAZOLAM 0.5 MG PO TABS: ORAL_TABLET | ORAL | Status: DC

## 2014-04-17 NOTE — Telephone Encounter (Signed)
RX called into [facility]-eh 

## 2014-04-17 NOTE — Telephone Encounter (Signed)
Refill request

## 2014-04-22 ENCOUNTER — Other Ambulatory Visit (INDEPENDENT_AMBULATORY_CARE_PROVIDER_SITE_OTHER): Payer: PRIVATE HEALTH INSURANCE

## 2014-04-22 DIAGNOSIS — Z8639 Personal history of other endocrine, nutritional and metabolic disease: Secondary | ICD-10-CM

## 2014-04-22 LAB — T4, FREE: FREE T4: 0.9 ng/dL (ref 0.60–1.60)

## 2014-04-22 LAB — TSH: TSH: 0.7 u[IU]/mL (ref 0.35–4.50)

## 2014-04-22 LAB — T3, FREE: T3, Free: 3.1 pg/mL (ref 2.3–4.2)

## 2014-06-25 ENCOUNTER — Ambulatory Visit (INDEPENDENT_AMBULATORY_CARE_PROVIDER_SITE_OTHER): Payer: PRIVATE HEALTH INSURANCE | Admitting: Internal Medicine

## 2014-06-25 ENCOUNTER — Encounter: Payer: Self-pay | Admitting: Internal Medicine

## 2014-06-25 VITALS — BP 131/80 | HR 65 | Resp 16 | Ht 63.0 in | Wt 153.0 lb

## 2014-06-25 DIAGNOSIS — R1011 Right upper quadrant pain: Secondary | ICD-10-CM

## 2014-06-25 DIAGNOSIS — R1013 Epigastric pain: Secondary | ICD-10-CM

## 2014-06-25 LAB — COMPREHENSIVE METABOLIC PANEL
ALT: 14 U/L (ref 0–35)
AST: 16 U/L (ref 0–37)
Albumin: 4.3 g/dL (ref 3.5–5.2)
Alkaline Phosphatase: 65 U/L (ref 39–117)
BUN: 18 mg/dL (ref 6–23)
CALCIUM: 9.7 mg/dL (ref 8.4–10.5)
CHLORIDE: 99 meq/L (ref 96–112)
CO2: 32 mEq/L (ref 19–32)
CREATININE: 0.58 mg/dL (ref 0.50–1.10)
Glucose, Bld: 94 mg/dL (ref 70–99)
POTASSIUM: 3.7 meq/L (ref 3.5–5.3)
SODIUM: 140 meq/L (ref 135–145)
Total Bilirubin: 0.4 mg/dL (ref 0.2–1.2)
Total Protein: 7.1 g/dL (ref 6.0–8.3)

## 2014-06-25 LAB — AMYLASE: Amylase: 59 U/L (ref 0–105)

## 2014-06-25 LAB — CBC WITH DIFFERENTIAL/PLATELET
BASOS ABS: 0 10*3/uL (ref 0.0–0.1)
BASOS PCT: 0 % (ref 0–1)
EOS PCT: 1 % (ref 0–5)
Eosinophils Absolute: 0.1 10*3/uL (ref 0.0–0.7)
HCT: 38.9 % (ref 36.0–46.0)
Hemoglobin: 12.7 g/dL (ref 12.0–15.0)
Lymphocytes Relative: 35 % (ref 12–46)
Lymphs Abs: 2.3 10*3/uL (ref 0.7–4.0)
MCH: 24.9 pg — AB (ref 26.0–34.0)
MCHC: 32.6 g/dL (ref 30.0–36.0)
MCV: 76.1 fL — AB (ref 78.0–100.0)
MONO ABS: 0.6 10*3/uL (ref 0.1–1.0)
Monocytes Relative: 9 % (ref 3–12)
Neutro Abs: 3.6 10*3/uL (ref 1.7–7.7)
Neutrophils Relative %: 55 % (ref 43–77)
PLATELETS: 245 10*3/uL (ref 150–400)
RBC: 5.11 MIL/uL (ref 3.87–5.11)
RDW: 14.8 % (ref 11.5–15.5)
WBC: 6.6 10*3/uL (ref 4.0–10.5)

## 2014-06-25 MED ORDER — PANTOPRAZOLE SODIUM 40 MG PO TBEC: 40.0000 mg | DELAYED_RELEASE_TABLET | Freq: Every day | ORAL | Status: DC

## 2014-06-25 NOTE — Telephone Encounter (Signed)
error 

## 2014-06-25 NOTE — Progress Notes (Signed)
Subjective:    Patient ID: Anna Werner, female    DOB: 12-24-1960, 54 y.o.   MRN: [id number]  HPI Brayton CavesJessie is here for acute visit.   One week of dyspepsia noted after eating.  Lots of belching and heartburn.  No night-time symptoms.   Using Prilosec  "feels like a lump in my throat"   No dysphagia  Also note RUQ pain over past 3 weeks.  No N/V  Noted after fatty meal.  LBM  Yesterday no blood or change in color.   No fever  No N/V  No Known Allergies Past Medical History  Diagnosis Date  . Anxiety   . Depression   . Arthritis   . Hypertension   . HSV-2 infection    Past Surgical History  Procedure Laterality Date  . Abdominal hysterectomy    . Hernia repair    . Breast lumpectomy     History   Social History  . Marital Status: Divorced    Spouse Name: N/A  . Number of Children: N/A  . Years of Education: N/A   Occupational History  . Not on file.   Social History Main Topics  . Smoking status: Former Smoker    Types: Cigarettes  . Smokeless tobacco: Never Used  . Alcohol Use: No  . Drug Use: No  . Sexual Activity: Yes   Other Topics Concern  . Not on file   Social History Narrative   Regular exercise: no   Caffeine use: coffee daily   History reviewed. No pertinent family history. Patient Active Problem List   Diagnosis Date Noted  . Low back strain 11/20/2013  . Menopausal syndrome (hot flushes) 06/19/2013  . Lymphadenopathy 03/07/2013  . Sinus bradycardia 12/13/2012  . Breast density 09/07/2012  . Arthralgia 03/15/2012  . Hx of colonic polyps 01/30/2012  . S/P hysterectomy 01/30/2012  . History of anemia 01/30/2012  . Breast calcifications on mammogram 01/30/2012  . Multiple thyroid nodules 01/30/2012  . Anxiety 01/13/2012  . Depression 01/13/2012  . EtOH dependence 01/13/2012  . Essential hypertension, benign 01/13/2012  . HSV-2 infection    Current Outpatient Prescriptions on File Prior to Visit  Medication Sig Dispense Refill  .  ALPRAZolam (XANAX) 0.5 MG tablet TAKE 1 TABLET(S) BY MOUTH DAILY AS NEEDED FOR ANXIETY 20 tablet 0  . aspirin 81 MG tablet Take 81 mg by mouth daily.    . calcium carbonate 200 MG capsule Take 500 mg by mouth 2 (two) times daily with a meal.    . cetirizine (ZYRTEC) 10 MG tablet Take 10 mg by mouth daily.    . cholecalciferol (VITAMIN D) 1000 UNITS tablet Take 1,000 Units by mouth daily.    . clindamycin (CLINDAGEL) 1 % gel Apply topically 2 (two) times daily. 30 g 0  . cyclobenzaprine (FLEXERIL) 10 MG tablet Take one at bedtime and 1/2 tablet during the day 30 tablet 1  . fish oil-omega-3 fatty acids 1000 MG capsule Take 2 g by mouth daily.    . Lactobacillus (ACIDOPHILUS) TABS Take 1 tablet by mouth daily.    Marland Kitchen. losartan-hydrochlorothiazide (HYZAAR) 100-25 MG per tablet Take 1 tablet by mouth daily. 90 tablet 1  . mometasone (ELOCON) 0.1 % cream Apply topically daily. 45 g 0  . Multiple Vitamins-Minerals (MULTI VITAMIN/MINERALS PO) Take by mouth.    . nabumetone (RELAFEN) 500 MG tablet Take 1 tablet (500 mg total) by mouth 2 (two) times daily. 60 tablet 0  . Nutritional Supplements (ESTROVEN PO)  Take 1 tablet by mouth daily.    . ranitidine (ZANTAC) 150 MG tablet Take 150 mg by mouth 2 (two) times daily.    . vitamin C (ASCORBIC ACID) 500 MG tablet Take 500 mg by mouth daily.     No current facility-administered medications on file prior to visit.       Review of Systems See HPI    Objective:   Physical Exam  Physical Exam  Nursing note and vitals reviewed.  Constitutional: She is oriented to person, place, and time. She appears well-developed and well-nourished.  HENT:  Head: Normocephalic and atraumatic.  Cardiovascular: Normal rate and regular rhythm. Exam reveals no gallop and no friction rub.  No murmur heard.  Pulmonary/Chest: Breath sounds normal. She has no wheezes. She has no rales.  Abd   BS  Pos  Soft  Minimal tenderness RUQ.  No HSM. Pt declined rectal exam     Neurological: She is alert and oriented to person, place, and time.  Skin: Skin is warm and dry.  Psychiatric: She has a normal mood and affect. Her behavior is normal.       Assessment & Plan:  Dyspepsia  Will try Protonix  Check all labs today  RUQ pain will get U/S   See me in 2 weeks or sooner prn

## 2014-06-26 ENCOUNTER — Ambulatory Visit (HOSPITAL_BASED_OUTPATIENT_CLINIC_OR_DEPARTMENT_OTHER)
Admission: RE | Admit: 2014-06-26 | Discharge: 2014-06-26 | Disposition: A | Discharge status: Home / Self Care | Payer: PRIVATE HEALTH INSURANCE | Source: Ambulatory Visit | Attending: Internal Medicine | Admitting: Internal Medicine

## 2014-06-26 DIAGNOSIS — R1011 Right upper quadrant pain: Secondary | ICD-10-CM | POA: Diagnosis present

## 2014-06-26 DIAGNOSIS — R1013 Epigastric pain: Secondary | ICD-10-CM

## 2014-06-26 DIAGNOSIS — R6881 Early satiety: Secondary | ICD-10-CM | POA: Insufficient documentation

## 2014-06-26 NOTE — Progress Notes (Signed)
Anna Werner is aware of lab results-eh

## 2014-06-27 ENCOUNTER — Telehealth: Payer: Self-pay | Admitting: *Deleted

## 2014-06-27 NOTE — Telephone Encounter (Signed)
-----   Message from Kendrick Rancheborah D Schoenhoff, MD sent at 06/27/2014 11:00 AM EST ----- Call pt and let her know that her liver and gallbladder look fine.  Advise her to keep follow up with me.  Ok to mail result to pt if she cannot see in My Chart

## 2014-06-27 NOTE — Telephone Encounter (Signed)
I spoke with patient and let her know the results of her U/S-eh

## 2014-07-03 ENCOUNTER — Encounter: Payer: Self-pay | Admitting: Internal Medicine

## 2014-07-03 ENCOUNTER — Ambulatory Visit (INDEPENDENT_AMBULATORY_CARE_PROVIDER_SITE_OTHER): Payer: PRIVATE HEALTH INSURANCE | Admitting: Internal Medicine

## 2014-07-03 VITALS — BP 140/83 | HR 70 | Resp 16 | Ht 63.0 in | Wt 151.0 lb

## 2014-07-03 DIAGNOSIS — I10 Essential (primary) hypertension: Secondary | ICD-10-CM

## 2014-07-03 DIAGNOSIS — F419 Anxiety disorder, unspecified: Secondary | ICD-10-CM

## 2014-07-03 DIAGNOSIS — R001 Bradycardia, unspecified: Secondary | ICD-10-CM

## 2014-07-03 MED ORDER — ESCITALOPRAM OXALATE 5 MG PO TABS: ORAL_TABLET | ORAL | Status: DC

## 2014-07-03 NOTE — Progress Notes (Signed)
Subjective:    Patient ID: Anna Werner, female    DOB: 1960-07-11, 54 y.o.   MRN: [id number]  HPI  2/16 note Assessment & Plan:  Dyspepsia Will try Protonix Check all labs today  RUQ pain will get U/S   See me in 2 weeks or sooner prn          TODAY   Brayton CavesJessie is here for follow up .  She tells me the Protonix is helping,   No dysphagia   She tells me she had a "panic attack"   3 days ago while driving.    She was driving in [place] area and felt very anxious, heart beating fast and hands  "locked up".  She called 911 who arrived and told pt she was having a panic attack.  Unclear why she was not taken for hospital evaluation.  Pt not able to identify what initiated anxiety  She reports she has anxiety and depression at times.  No S/H ideation.   Able to function at work .   She is afraid of dependency and does not use her Xanax .  She had some dependency issues with Xanax in the past  She has not been to see her psychiatrist in some time  No Known Allergies Past Medical History  Diagnosis Date  . Anxiety   . Depression   . Arthritis   . Hypertension   . HSV-2 infection    Past Surgical History  Procedure Laterality Date  . Abdominal hysterectomy    . Hernia repair    . Breast lumpectomy     History   Social History  . Marital Status: Divorced    Spouse Name: N/A  . Number of Children: N/A  . Years of Education: N/A   Occupational History  . Not on file.   Social History Main Topics  . Smoking status: Former Smoker    Types: Cigarettes  . Smokeless tobacco: Never Used  . Alcohol Use: No  . Drug Use: No  . Sexual Activity: Yes   Other Topics Concern  . Not on file   Social History Narrative   Regular exercise: no   Caffeine use: coffee daily   No family history on file. Patient Active Problem List   Diagnosis Date Noted  . Low back strain 11/20/2013  . Menopausal syndrome (hot flushes) 06/19/2013  . Lymphadenopathy 03/07/2013  . Sinus  bradycardia 12/13/2012  . Breast density 09/07/2012  . Arthralgia 03/15/2012  . Hx of colonic polyps 01/30/2012  . S/P hysterectomy 01/30/2012  . History of anemia 01/30/2012  . Breast calcifications on mammogram 01/30/2012  . Multiple thyroid nodules 01/30/2012  . Anxiety 01/13/2012  . Depression 01/13/2012  . EtOH dependence 01/13/2012  . Essential hypertension, benign 01/13/2012  . HSV-2 infection    Current Outpatient Prescriptions on File Prior to Visit  Medication Sig Dispense Refill  . ALPRAZolam (XANAX) 0.5 MG tablet TAKE 1 TABLET(S) BY MOUTH DAILY AS NEEDED FOR ANXIETY 20 tablet 0  . aspirin 81 MG tablet Take 81 mg by mouth daily.    . calcium carbonate 200 MG capsule Take 500 mg by mouth 2 (two) times daily with a meal.    . cetirizine (ZYRTEC) 10 MG tablet Take 10 mg by mouth daily.    . cholecalciferol (VITAMIN D) 1000 UNITS tablet Take 1,000 Units by mouth daily.    . clindamycin (CLINDAGEL) 1 % gel Apply topically 2 (two) times daily. 30 g 0  . cyclobenzaprine (  FLEXERIL) 10 MG tablet Take one at bedtime and 1/2 tablet during the day 30 tablet 1  . fish oil-omega-3 fatty acids 1000 MG capsule Take 2 g by mouth daily.    . Lactobacillus (ACIDOPHILUS) TABS Take 1 tablet by mouth daily.    Marland Kitchen losartan-hydrochlorothiazide (HYZAAR) 100-25 MG per tablet Take 1 tablet by mouth daily. 90 tablet 1  . mometasone (ELOCON) 0.1 % cream Apply topically daily. 45 g 0  . Multiple Vitamins-Minerals (MULTI VITAMIN/MINERALS PO) Take by mouth.    . nabumetone (RELAFEN) 500 MG tablet Take 1 tablet (500 mg total) by mouth 2 (two) times daily. 60 tablet 0  . Nutritional Supplements (ESTROVEN PO) Take 1 tablet by mouth daily.    . pantoprazole (PROTONIX) 40 MG tablet Take 1 tablet (40 mg total) by mouth daily. 30 tablet 1  . ranitidine (ZANTAC) 150 MG tablet Take 150 mg by mouth 2 (two) times daily.    . vitamin C (ASCORBIC ACID) 500 MG tablet Take 500 mg by mouth daily.     No current  facility-administered medications on file prior to visit.      Review of Systems See HPI    Objective:   Physical Exam  Physical Exam  Nursing note and vitals reviewed.  Constitutional: She is oriented to person, place, and time. She appears well-developed and well-nourished.  HENT:  Head: Normocephalic and atraumatic.  Cardiovascular: Normal rate and regular rhythm. Exam reveals no gallop and no friction rub.  No murmur heard.  Pulmonary/Chest: Breath sounds normal. She has no wheezes. She has no rales.  Neurological: She is alert and oriented to person, place, and time.  Skin: Skin is warm and dry.  Psychiatric: She has a normal mood and affect. Her behavior is normal.          Assessment & Plan:  Anxiety ?? Panic     Will start Lexapro 5 mg for 7 days then 10 mg.    Xanax prn .   See me in 6 weeks  EKG today  Sinus bradycardia   No comparison changes  RUQ pain  Improved   HTN continue meds  See me in 6 weeks

## 2014-07-09 ENCOUNTER — Other Ambulatory Visit: Payer: Self-pay | Admitting: Internal Medicine

## 2014-07-09 DIAGNOSIS — F419 Anxiety disorder, unspecified: Secondary | ICD-10-CM

## 2014-07-09 NOTE — Telephone Encounter (Signed)
Refill request

## 2014-07-10 ENCOUNTER — Ambulatory Visit: Payer: PRIVATE HEALTH INSURANCE | Admitting: Internal Medicine

## 2014-07-10 MED ORDER — ALPRAZOLAM 0.5 MG PO TABS: ORAL_TABLET | ORAL | Status: DC

## 2014-07-10 NOTE — Telephone Encounter (Signed)
Refill request

## 2014-08-14 ENCOUNTER — Ambulatory Visit: Payer: PRIVATE HEALTH INSURANCE | Admitting: Internal Medicine

## 2014-09-11 LAB — HM MAMMOGRAPHY

## 2014-09-12 ENCOUNTER — Other Ambulatory Visit: Payer: Self-pay | Admitting: *Deleted

## 2014-09-12 ENCOUNTER — Telehealth: Payer: Self-pay | Admitting: *Deleted

## 2014-09-12 DIAGNOSIS — F419 Anxiety disorder, unspecified: Secondary | ICD-10-CM

## 2014-09-12 NOTE — Telephone Encounter (Signed)
Refill request

## 2014-09-12 NOTE — Telephone Encounter (Signed)
Error

## 2014-09-15 MED ORDER — PANTOPRAZOLE SODIUM 40 MG PO TBEC: 40.0000 mg | DELAYED_RELEASE_TABLET | Freq: Every day | ORAL | Status: DC

## 2014-09-15 MED ORDER — LOSARTAN POTASSIUM-HCTZ 100-25 MG PO TABS: 1.0000 | ORAL_TABLET | Freq: Every day | ORAL | Status: DC

## 2014-09-15 MED ORDER — ESCITALOPRAM OXALATE 5 MG PO TABS: ORAL_TABLET | ORAL | Status: DC

## 2014-09-15 MED ORDER — ALPRAZOLAM 0.5 MG PO TABS: ORAL_TABLET | ORAL | Status: DC

## 2014-09-16 NOTE — Telephone Encounter (Signed)
RX called in -eh 

## 2014-09-17 ENCOUNTER — Encounter: Payer: Self-pay | Admitting: *Deleted

## 2015-04-10 ENCOUNTER — Ambulatory Visit: Payer: PRIVATE HEALTH INSURANCE

## 2015-05-20 ENCOUNTER — Ambulatory Visit: Payer: PRIVATE HEALTH INSURANCE | Attending: Rheumatology | Admitting: Physical Therapy

## 2015-05-20 DIAGNOSIS — M1711 Unilateral primary osteoarthritis, right knee: Secondary | ICD-10-CM

## 2015-05-20 DIAGNOSIS — M179 Osteoarthritis of knee, unspecified: Secondary | ICD-10-CM | POA: Diagnosis present

## 2015-05-20 DIAGNOSIS — M25551 Pain in right hip: Secondary | ICD-10-CM | POA: Insufficient documentation

## 2015-05-20 DIAGNOSIS — M545 Low back pain: Secondary | ICD-10-CM | POA: Diagnosis not present

## 2015-05-20 DIAGNOSIS — M1712 Unilateral primary osteoarthritis, left knee: Secondary | ICD-10-CM

## 2015-05-20 NOTE — Patient Instructions (Signed)
Lower Trunk Rotation Stretch    Keeping back flat and feet together, rotate knees to left side. Hold __20-30__ seconds.  Repeat to other side. Repeat _2-3___ times per set. Do __1__ sets per session. Do __2-3__ sessions per day.  http://orth.exer.us/123   Copyright  VHI. All rights reserved.    Knee to Chest    Lying supine, bend knee to chest and hold 20-30 seconds. Repeat 2-3___ times. Repeat with other leg. Do _2-3__ times per day.  Copyright  VHI. All rights reserved.

## 2015-05-20 NOTE — Therapy (Signed)
[facility] [address]  [address] [place], [place], [address] Phone: [phone]   Fax:  [phone]  Physical Therapy Evaluation  Patient Details  Name: Anna JohnsJessie Lavigne MRN: [id number] Date of Birth: 1960-12-01 Referring Provider: Rolanda Jayeveshwar, Shali B.  Encounter Date: 05/20/2015      PT End of Session - 05/20/15 1614    Visit Number 1   Number of Visits 6   Date for PT Re-Evaluation 07/01/15   PT Start Time 1530   PT Stop Time 1608   PT Time Calculation (min) 38 min   Activity Tolerance Patient tolerated treatment well   Behavior During Therapy [facility] for tasks assessed/performed      Past Medical History  Diagnosis Date  . Anxiety   . Depression   . Arthritis   . Hypertension   . HSV-2 infection     Past Surgical History  Procedure Laterality Date  . Abdominal hysterectomy    . Hernia repair    . Breast lumpectomy      There were no vitals filed for this visit.  Visit Diagnosis:  Midline low back pain, with sciatica presence unspecified  Osteoarthritis of left knee, unspecified osteoarthritis type  Osteoarthritis of right knee, unspecified osteoarthritis type  Hip joint pain, right      Subjective Assessment - 05/20/15 1536    Subjective Pt is a 55 y.o female referred for LBP and bil knee OA. Pt reported that LBP was more problematic for her than the knee pain. Pt. described LBP as a pull and a ache in the mid lumbar region. Pt also described pain in R hip that radiates down into R leg anteriorly. Pt complained of the R knee "catching when I turn".    Pertinent History Anxiety; Depression; Arthritis; Hypertension   How long can you sit comfortably? 1 hour   How long can you stand comfortably? 30 minutes   How long can you walk comfortably? "depends" (based on duration)   Patient Stated Goals decrease pain, learn exercises for low back and knees   Currently in Pain? No/denies   Pain Location Back   Pain Orientation Lower;Mid   Pain Descriptors / Indicators Radiating;Sore;Aching   Pain Type Chronic pain   Pain Radiating Towards R hip   Pain Onset More than a month ago   Pain Frequency Constant   Aggravating Factors  lifting, standing   Pain Relieving Factors heat, advil   Multiple Pain Sites Yes   Pain Score 0  At worse 7/10   Pain Location Knee   Pain Orientation Right;Left   Pain Descriptors / Indicators Aching;Sharp   Pain Type Chronic pain   Pain Onset More than a month ago   Pain Frequency Intermittent   Aggravating Factors  standing, turning    Pain Relieving Factors heat, lidocaine, advil            [facility] PT Assessment - 05/20/15 1546    Assessment   Medical Diagnosis LBP; bil knee OA   Referring Provider Deveshwar, Shali B.   Onset Date/Surgical Date --  chronic x 2-3 years   Next MD Visit PRN   Prior Therapy 2 years ago   Precautions   Precautions None   Balance Screen   Has the patient fallen in the past 6 months No   Has the patient had a decrease in activity level because of a fear of falling?  No   Is the patient reluctant to leave their home  because of a fear of falling?  No   Home Environment   Additional Comments has stairs pt reports no difficulty   Prior Function   Level of Independence Independent   Vocation Full time employment   Vocation Requirements LPN; walking   Leisure read   Observation/Other Assessments   Focus on Therapeutic Outcomes (FOTO)  59 (41% limited, predicted 36% limited)   Posture/Postural Control   Posture/Postural Control Postural limitations   Postural Limitations Forward head;Rounded Shoulders   AROM   Overall AROM Comments lumbar flexion 50% limited, lumbar Ext WNL, pain and pulling in all directions   Strength   Strength Assessment Site Knee;Hip   Right Hip Flexion 4/5   Right Hip Extension 3-/5   Right Hip ABduction 3/5   Right Hip ADduction 4/5   Left Hip Flexion 4/5   Left Hip Extension 3-/5   Left Hip  ABduction 3/5   Left Hip ADduction 4/5   Right Knee Flexion 4/5   Right Knee Extension 4/5  pain   Left Knee Flexion 4/5   Left Knee Extension 4/5   Flexibility   Soft Tissue Assessment /Muscle Length yes   Hamstrings WNL, mild pull at 60 degree hip flex   Piriformis Tight R   Palpation   Palpation comment tenderness with all palpation along lumbar spine   Special Tests    Special Tests Lumbar   Lumbar Tests Straight Leg Raise   Straight Leg Raise   Findings Negative   Comment negative bil   Ambulation/Gait   Gait Comments no gait abnormalities noted                           PT Education - 05/20/15 1612    Education provided Yes   Education Details Lower trunk rotation stretch and single knee to chest supine    Person(s) Educated Patient   Methods Explanation;Demonstration;Handout   Comprehension Verbalized understanding;Returned demonstration;Need further instruction          PT Short Term Goals - 05/20/15 1646    PT SHORT TERM GOAL #1   Title Pt will be independent with HEP (06/10/15)   Time 3   Period Weeks   Status New           PT Long Term Goals - 05/20/15 1648    PT LONG TERM GOAL #1   Title Pt. will walk >/= 4 hours without increased pain in order to meet work demands. (07/01/15)   Time 6   Period Weeks   Status New   PT LONG TERM GOAL #2   Title Pt. will lift 10 Ibs. without an increase in pain in order to carry everyday items (07/01/15)   Time 6   Period Weeks   Status New   PT LONG TERM GOAL #3   Title Pt. will perform full lumbar ROM without an increase in pain in order to meet work demands (07/01/15)   Time 6   Period Weeks   Status New   PT LONG TERM GOAL #4   Title Pt. will verbalize understanding of proper posture and body mechanics to decrease risk of reinjury (07/01/15)   Time 6   Period Weeks   Status New               Plan - 05/20/15 1629    Clinical Impression Statement Pt. is a 55 y.o. female who  presents with LBP and bilateral knee pain. Pt demonstrates  strength deficits and partial lumbar flexion ROM deficit. Pt. will benefit from OPPT in order to decrease pain and improve function for work activity requirements.    Pt will benefit from skilled therapeutic intervention in order to improve on the following deficits Pain;Decreased strength;Impaired flexibility   Rehab Potential Good   PT Frequency 1x / week   PT Duration 6 weeks   PT Treatment/Interventions Electrical Stimulation;Moist Heat;Therapeutic exercise;Therapeutic activities;Manual techniques;Functional mobility training   PT Next Visit Plan add to HEP, hip/knee strengthening   Consulted and Agree with Plan of Care Patient         Problem List Patient Active Problem List   Diagnosis Date Noted  . Low back strain 11/20/2013  . Menopausal syndrome (hot flushes) 06/19/2013  . Lymphadenopathy 03/07/2013  . Sinus bradycardia 12/13/2012  . Breast density 09/07/2012  . Arthralgia 03/15/2012  . Hx of colonic polyps 01/30/2012  . S/P hysterectomy 01/30/2012  . History of anemia 01/30/2012  . Breast calcifications on mammogram 01/30/2012  . Multiple thyroid nodules 01/30/2012  . Anxiety 01/13/2012  . Depression 01/13/2012  . EtOH dependence (HCC) 01/13/2012  . Essential hypertension, benign 01/13/2012  . HSV-2 infection       Brown Human, SPT 05/21/2015  7:39 AM  [facility] [address]  [address] [place], [place], [address] Phone: [phone]   Fax:  [phone]  Name: Anna Werner MRN: [id number] Date of Birth: 10-29-1960

## 2015-05-26 ENCOUNTER — Ambulatory Visit: Payer: PRIVATE HEALTH INSURANCE | Admitting: Physical Therapy

## 2015-05-26 DIAGNOSIS — M545 Low back pain: Secondary | ICD-10-CM | POA: Diagnosis not present

## 2015-05-26 DIAGNOSIS — M1711 Unilateral primary osteoarthritis, right knee: Secondary | ICD-10-CM

## 2015-05-26 DIAGNOSIS — M1712 Unilateral primary osteoarthritis, left knee: Secondary | ICD-10-CM

## 2015-05-26 DIAGNOSIS — M25551 Pain in right hip: Secondary | ICD-10-CM

## 2015-05-26 NOTE — Therapy (Signed)
[facility] [address]  [address] [place], [place], [address] Phone: [phone]   Fax:  [phone]  Physical Therapy Treatment  Patient Details  Name: Anna Werner MRN: [id number] Date of Birth: 1961-01-01 Referring Provider: Rolanda Jay  Encounter Date: 05/26/2015      PT End of Session - 05/26/15 1609    Visit Number 2   Number of Visits 6   Date for PT Re-Evaluation 07/01/15   PT Start Time 1531   PT Stop Time 1612   PT Time Calculation (min) 41 min   Activity Tolerance Patient tolerated treatment well   Behavior During Therapy [facility] for tasks assessed/performed      Past Medical History  Diagnosis Date  . Anxiety   . Depression   . Arthritis   . Hypertension   . HSV-2 infection     Past Surgical History  Procedure Laterality Date  . Abdominal hysterectomy    . Hernia repair    . Breast lumpectomy      There were no vitals filed for this visit.  Visit Diagnosis:  Midline low back pain, with sciatica presence unspecified  Osteoarthritis of left knee, unspecified osteoarthritis type  Osteoarthritis of right knee, unspecified osteoarthritis type  Hip joint pain, right      Subjective Assessment - 05/26/15 1537    Subjective Both back and knees are feeling better; stretches seem to help back.   Currently in Pain? No/denies   Pain Score 0                         [facility] Adult PT Treatment/Exercise - 05/26/15 1538    Exercises   Exercises Knee/Hip;Lumbar   Lumbar Exercises: Stretches   Passive Hamstring Stretch 3 reps;30 seconds   Single Knee to Chest Stretch 3 reps;30 seconds   Lower Trunk Rotation 3 reps;30 seconds   Knee/Hip Exercises: Aerobic   Recumbent Bike L2 x 4 min   Nustep L5 x 5 min   Knee/Hip Exercises: Supine   Bridges 20 reps   Straight Leg Raises Both;20 reps   Straight Leg Raises Limitations with ab set   Other Supine Knee/Hip Exercises single limb  clamshell with blue theraband x 20 bil   Knee/Hip Exercises: Sidelying   Hip ABduction Both;20 reps   Knee/Hip Exercises: Prone   Straight Leg Raises Both;20 reps                PT Education - 05/26/15 1609    Education provided Yes   Education Details HEP   Person(s) Educated Patient   Methods Explanation;Demonstration;Handout   Comprehension Verbalized understanding;Returned demonstration;Need further instruction          PT Short Term Goals - 05/26/15 1611    PT SHORT TERM GOAL #1   Title Pt will be independent with HEP (06/10/15)   Status On-going           PT Long Term Goals - 05/26/15 1611    PT LONG TERM GOAL #1   Title Pt. will walk >/= 4 hours without increased pain in order to meet work demands. (07/01/15)   Status On-going   PT LONG TERM GOAL #2   Title Pt. will lift 10 Ibs. without an increase in pain in order to carry everyday items (07/01/15)   Status On-going   PT LONG TERM GOAL #3   Title Pt. will perform full lumbar ROM without an increase in  pain in order to meet work demands (07/01/15)   Status On-going   PT LONG TERM GOAL #4   Title Pt. will verbalize understanding of proper posture and body mechanics to decrease risk of reinjury (07/01/15)   Status On-going               Plan - 05/26/15 1610    Clinical Impression Statement Pt noticably fatigued with session today.  Will continue to benefit from PT to maximize function.  Overally reports relief with stretches.   PT Next Visit Plan review HEP,continue strengthening   Consulted and Agree with Plan of Care Patient        Problem List Patient Active Problem List   Diagnosis Date Noted  . Low back strain 11/20/2013  . Menopausal syndrome (hot flushes) 06/19/2013  . Lymphadenopathy 03/07/2013  . Sinus bradycardia 12/13/2012  . Breast density 09/07/2012  . Arthralgia 03/15/2012  . Hx of colonic polyps 01/30/2012  . S/P hysterectomy 01/30/2012  . History of anemia 01/30/2012   . Breast calcifications on mammogram 01/30/2012  . Multiple thyroid nodules 01/30/2012  . Anxiety 01/13/2012  . Depression 01/13/2012  . EtOH dependence (HCC) 01/13/2012  . Essential hypertension, benign 01/13/2012  . HSV-2 infection    Clarita CraneStephanie F Hagan Maltz, PT, DPT 05/26/2015 4:12 PM  [facility] [address]  [address] [place], [place], [address] Phone: [phone]   Fax:  [phone]  Name: Anna Werner MRN: [id number] Date of Birth: 16-Nov-1960

## 2015-05-26 NOTE — Patient Instructions (Signed)
Hamstring Step 1    Straighten left knee. Keep knee level with other knee or on bolster. Hold _30__ seconds. Relax knee by returning foot to start. Repeat _3__ times. Perform 2-3 times per day.  Copyright  VHI. All rights reserved.   Straight Leg Raise    Tighten stomach and slowly raise locked right leg _6-8___ inches from floor.  Repeat with other leg. Repeat __20__ times per set. Do __1_ sets per session. Do __2-3__ sessions per day.  http://orth.exer.us/1103   Copyright  VHI. All rights reserved.   Strengthening: Hip Abduction (Side-Lying)    Tighten muscles on front of left thigh, then lift leg _6-8___ inches from surface, keeping knee locked. Repeat with other leg. Repeat _20___ times per set. Do __1__ sets per session. Do _2-3___ sessions per day.  http://orth.exer.us/623   Copyright  VHI. All rights reserved.   Hip Extension: Prone    Tighten gluteal muscle. Lift one leg _20__ times. Repeat with other leg. Keep pelvis still. Be sure pelvis does not rotate and back does not arch. Do __1_ sets, __2-3_ times per day.  http://ss.exer.us/65   Copyright  VHI. All rights reserved.

## 2015-06-03 ENCOUNTER — Ambulatory Visit: Payer: PRIVATE HEALTH INSURANCE | Admitting: Physical Therapy

## 2015-06-03 DIAGNOSIS — M1711 Unilateral primary osteoarthritis, right knee: Secondary | ICD-10-CM

## 2015-06-03 DIAGNOSIS — M545 Low back pain: Secondary | ICD-10-CM | POA: Diagnosis not present

## 2015-06-03 DIAGNOSIS — M1712 Unilateral primary osteoarthritis, left knee: Secondary | ICD-10-CM

## 2015-06-03 DIAGNOSIS — M25551 Pain in right hip: Secondary | ICD-10-CM

## 2015-06-03 NOTE — Patient Instructions (Signed)

## 2015-06-03 NOTE — Therapy (Addendum)
[facility] [address]  [place], [place], [address] Phone: [phone]   Fax:  [phone]  Physical Therapy Treatment  Patient Details  Name: Anna Werner MRN: [id number] Date of Birth: May 04, 1961 Referring Provider: Janett Billow  Encounter Date: 06/03/2015      PT End of Session - 06/03/15 1550    Visit Number 3   Number of Visits 6   Date for PT Re-Evaluation 07/01/15   PT Start Time 0321   PT Stop Time 1600  pt stated she needed to leave early   PT Time Calculation (min) 30 min   Activity Tolerance Patient limited by pain   Behavior During Therapy [facility] for tasks assessed/performed      Past Medical History  Diagnosis Date  . Anxiety   . Depression   . Arthritis   . Hypertension   . HSV-2 infection     Past Surgical History  Procedure Laterality Date  . Abdominal hysterectomy    . Hernia repair    . Breast lumpectomy      There were no vitals filed for this visit.  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   Patient Stated Goals decrease pain, learn exercises for low back and knees   Currently in Pain? Yes   Pain Score 6    Pain Location Back   Pain Orientation Mid   Pain Descriptors / Indicators Aching   Pain Type Chronic pain  acute on chronic   Pain Onset More than a month ago   Pain Frequency Constant   Aggravating Factors  lifting, standing   Pain Relieving Factors heat, advil                         [facility] Adult PT Treatment/Exercise - 06/03/15 1538    Lumbar Exercises: Stretches   Single Knee to Chest  Stretch 2 reps;20 seconds   Lower Trunk Rotation 2 reps;20 seconds   Knee/Hip Exercises: Aerobic   Nustep L5 x 5 min   Modalities   Modalities Moist Heat;Electrical Stimulation   Moist Heat Therapy   Number Minutes Moist Heat 12 Minutes   Moist Heat Location Lumbar Spine   Electrical Stimulation   Electrical Stimulation Location low back   Electrical Stimulation Action IFC   Electrical Stimulation Parameters to tolerance   Electrical Stimulation Goals Pain                PT Education - 06/03/15 1549    Education provided Yes   Education Details posture and body mechanics   Person(s) Educated Patient   Methods Explanation;Handout   Comprehension Verbalized understanding          PT Short Term Goals - 05/26/15 1611    PT SHORT TERM GOAL #1   Title Pt will be independent with HEP (06/10/15)   Status On-going           PT Long Term Goals - 05/26/15 1611    PT LONG TERM GOAL #1   Title Pt. will walk >/= 4 hours without increased pain in order to meet  work demands. (07/01/15)   Status On-going   PT LONG TERM GOAL #2   Title Pt. will lift 10 Ibs. without an increase in pain in order to carry everyday items (07/01/15)   Status On-going   PT LONG TERM GOAL #3   Title Pt. will perform full lumbar ROM without an increase in pain in order to meet work demands (07/01/15)   Status On-going   PT LONG TERM GOAL #4   Title Pt. 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  Interested in possible home TENS unit therefore estim with moist heat used today to see if pt notices benefit.  Will plan to send referral to MD for home TENS unit.     PT Next Visit Plan review HEP,continue strengthening, issue home TENS unit if order signed  and pt reports relief after estim   Consulted and Agree with Plan of Care Patient        Problem List Patient Active Problem List   Diagnosis Date Noted  . Low back strain 11/20/2013  . Menopausal syndrome (hot flushes) 06/19/2013  . Lymphadenopathy 03/07/2013  . Sinus bradycardia 12/13/2012  . Breast density 09/07/2012  . Arthralgia 03/15/2012  . Hx of colonic polyps 01/30/2012  . S/P hysterectomy 01/30/2012  . History of anemia 01/30/2012  . Breast calcifications on mammogram 01/30/2012  . Multiple thyroid nodules 01/30/2012  . Anxiety 01/13/2012  . Depression 01/13/2012  . EtOH dependence ([place]) 01/13/2012  . Essential hypertension, benign 01/13/2012  . HSV-2 infection    Laureen Abrahams, PT, DPT 06/03/2015 4:01 PM  [facility] [address]  [place], [place], [address] Phone: [phone]   Fax:  [phone]  Name: Anna Werner MRN: [id number] Date of Birth: February 28, 1961      PHYSICAL THERAPY DISCHARGE SUMMARY  Visits from Start of Care: 3  Current functional level related to goals / functional outcomes: See above   Remaining deficits: Unknown; pt called and canceled appointments; requested d/c with no further information given.  Order obtained for TENS unit per pt's request but pt did not return to obtain TENS unit.   Education / Equipment: HEP  Plan: Patient agrees to discharge.  Patient goals were not met. Patient is being discharged due to the patient's request.  ?????     Laureen Abrahams, PT, DPT 06/16/2015 2:55 PM  [facility] [place], [place] [address]  [phone] (office) [phone] (fax)

## 2015-06-10 ENCOUNTER — Ambulatory Visit: Payer: PRIVATE HEALTH INSURANCE

## 2015-06-17 ENCOUNTER — Ambulatory Visit: Payer: PRIVATE HEALTH INSURANCE

## 2015-06-27 ENCOUNTER — Telehealth: Payer: Self-pay | Admitting: *Deleted

## 2015-06-27 ENCOUNTER — Encounter: Payer: Self-pay | Admitting: *Deleted

## 2015-06-27 NOTE — Telephone Encounter (Signed)
Pre-Visit Call completed with patient and chart updated.   Pre-Visit Info documented in Specialty Comments under SnapShot.    

## 2015-06-30 ENCOUNTER — Ambulatory Visit (INDEPENDENT_AMBULATORY_CARE_PROVIDER_SITE_OTHER): Payer: PRIVATE HEALTH INSURANCE | Admitting: Family

## 2015-06-30 ENCOUNTER — Encounter: Payer: Self-pay | Admitting: Family

## 2015-06-30 VITALS — BP 148/90 | HR 63 | Temp 98.1°F | Resp 16 | Ht 63.0 in | Wt 149.0 lb

## 2015-06-30 DIAGNOSIS — F419 Anxiety disorder, unspecified: Secondary | ICD-10-CM

## 2015-06-30 DIAGNOSIS — B009 Herpesviral infection, unspecified: Secondary | ICD-10-CM

## 2015-06-30 DIAGNOSIS — R635 Abnormal weight gain: Secondary | ICD-10-CM

## 2015-06-30 DIAGNOSIS — I10 Essential (primary) hypertension: Secondary | ICD-10-CM

## 2015-06-30 MED ORDER — ALPRAZOLAM 0.5 MG PO TABS: ORAL_TABLET | ORAL | Status: DC

## 2015-06-30 MED ORDER — AMLODIPINE BESYLATE 5 MG PO TABS: 5.0000 mg | ORAL_TABLET | Freq: Every day | ORAL | Status: DC

## 2015-06-30 NOTE — Assessment & Plan Note (Signed)
BP recheck was 165/95.  Will add amlodipine  once daily for blood pressure control. Continue Hyzaar.

## 2015-06-30 NOTE — Assessment & Plan Note (Signed)
Stable.  Monitor.  

## 2015-06-30 NOTE — Patient Instructions (Addendum)
Start amlodipine  once daily for your blood pressure. Welcome to [facility]!

## 2015-06-30 NOTE — Assessment & Plan Note (Signed)
Asymptomatic. 

## 2015-06-30 NOTE — Progress Notes (Signed)
Pre visit review using our clinic review tool, if applicable. No additional management support is needed unless otherwise documented below in the visit note. 

## 2015-06-30 NOTE — Progress Notes (Signed)
Subjective:    Patient ID: Anna Werner, female    DOB: 07-29-1960, 55 y.o.   MRN: [id number]  HPI  Anna Werner is a 55 yr old female who presents today to establish care.  She was previously followed by Dr. Constance Goltz (record reviewed).  HTN- current meds include hyzaar. Reports that her BP at work 130-140's/80-90's BP Readings from Last 3 Encounters:  06/30/15 148/90  07/03/14 140/83  06/25/14 131/80   Anxiety/Depression- Reports stable. Was situational. Occurred following her divorce.  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    Review of Systems  Constitutional:       Reports 20 pound weight gain since October. (lost weight for her daughter's wedding) Wt Readings from Last 3 Encounters: 06/30/15 : 149 lb (67.586 kg) 07/03/14 : 151 lb (68.493 kg) 06/25/14 : 153 lb (69.4 kg)    HENT: Negative for hearing loss and rhinorrhea.   Eyes: Negative for visual disturbance.  Respiratory: Negative for cough.   Cardiovascular: Negative for leg swelling.  Gastrointestinal: Negative for diarrhea.       Uses senokot-s daily for chronic constipation  Genitourinary: Negative for dysuria and frequency.  Musculoskeletal: Negative for myalgias.       Some chronic knee and back pain  Skin: Negative for rash.  Hematological: Negative for adenopathy.  Psychiatric/Behavioral:       See HPI   Past Medical History  Diagnosis Date  . Anxiety   . Depression   . Arthritis   . Hypertension   . HSV-2 infection   . Thyroid nodule     Dr Elvera Lennox    Social History   Social History  . Marital Status: Divorced    Spouse Name: N/A  . Number of Children: N/A  . Years of Education: N/A   Occupational History  . Not on file.   Social History Main Topics  . Smoking status: Former Smoker    Types: Cigarettes  . Smokeless tobacco: Never Used  . Alcohol Use: 4.2 oz/week    7 Glasses of wine per week  . Drug Use: No  . Sexual Activity: Yes   Other Topics Concern  . Not on file   Social  History Narrative   Regular exercise: no   Caffeine use: coffee daily   Daughter in [place]- has son   Son in [place]   Works as an Public house manager at [employer].    Enjoys reading and spending time with grandson        Past Surgical History  Procedure Laterality Date  . Abdominal hysterectomy    . Hernia repair    . Breast lumpectomy Right 2012?  Marland Kitchen Breast biopsy Left ?  . Biopsy thyroid  05/10/13    Family History  Problem Relation Age of Onset  . Family history unknown: Yes    No Known Allergies  Current Outpatient Prescriptions on File Prior to Visit  Medication Sig Dispense Refill  . ALPRAZolam (XANAX) 0.5 MG tablet TAKE 1 TABLET(S) BY MOUTH DAILY AS NEEDED FOR ANXIETY 20 tablet 0  . calcium carbonate 200 MG capsule Take 500 mg by mouth 2 (two) times daily with a meal.    . cetirizine (ZYRTEC) 10 MG tablet Take 10 mg by mouth as needed.     . cholecalciferol (VITAMIN D) 1000 UNITS tablet Take 1,000 Units by mouth daily.    . fish oil-omega-3 fatty acids 1000 MG capsule Take 2 g by mouth daily.    . Lactobacillus (ACIDOPHILUS) TABS Take 1 tablet  by mouth daily.    Marland Kitchen losartan-hydrochlorothiazide (HYZAAR) 100-25 MG per tablet Take 1 tablet by mouth daily. 90 tablet 1  . mometasone (ELOCON) 0.1 % cream Apply topically daily. 45 g 0  . pantoprazole (PROTONIX) 40 MG tablet Take 1 tablet (40 mg total) by mouth daily. (Patient taking differently: Take 40 mg by mouth daily as needed. ) 90 tablet 1  . ranitidine (ZANTAC) 150 MG tablet Take 150 mg by mouth 2 (two) times daily as needed.     . vitamin C (ASCORBIC ACID) 500 MG tablet Take 500 mg by mouth daily.     No current facility-administered medications on file prior to visit.    BP 148/90 mmHg  Pulse 63  Temp(Src) 98.1 F (36.7 C) (Oral)  Resp 16  Ht  (1.6 m)  Wt 149 lb (67.586 kg)  BMI 26.40 kg/m2  SpO2 100%       Objective:   Physical Exam  Constitutional: She is oriented to person, place, and time. She appears  well-developed and well-nourished.  HENT:  Right Ear: Tympanic membrane and ear canal normal.  Left Ear: Tympanic membrane and ear canal normal.  Mouth/Throat: No oropharyngeal exudate, posterior oropharyngeal edema or posterior oropharyngeal erythema.  Neck: Neck supple. No thyromegaly present.  Cardiovascular: Normal rate, regular rhythm and normal heart sounds.   No murmur heard. Pulmonary/Chest: Effort normal and breath sounds normal. No respiratory distress. She has no wheezes.  Lymphadenopathy:    She has no cervical adenopathy.  Neurological: She is alert and oriented to person, place, and time.  Skin: Skin is warm and dry.  Psychiatric: She has a normal mood and affect. Her behavior is normal. Judgment and thought content normal.          Assessment & Plan:   Weight Gain- check TSH.

## 2015-07-15 ENCOUNTER — Encounter: Payer: Self-pay | Admitting: Family

## 2015-07-15 ENCOUNTER — Other Ambulatory Visit: Payer: PRIVATE HEALTH INSURANCE

## 2015-07-15 ENCOUNTER — Ambulatory Visit: Payer: PRIVATE HEALTH INSURANCE

## 2015-07-15 VITALS — HR 75

## 2015-07-15 DIAGNOSIS — I10 Essential (primary) hypertension: Secondary | ICD-10-CM

## 2015-07-15 NOTE — Progress Notes (Signed)
Pre visit review using our clinic review tool, if applicable. No additional management support is needed unless otherwise documented below in the visit note.  Patient in for repeat BP check per Macario CarlsMelissa Osullivan. Blood  Pressure this visit 118/83., pulse =75. Per C. Daphine DeutscherMartin, [place] covering for Verdene LennertM. Osullivan NP patient to continue same dose of BP medications and return when scheduled to see M. Peggyann Juba'Sullivan NP,  which will be for patients annual physical.

## 2015-07-15 NOTE — Progress Notes (Signed)
edInformed by lab that patient did not siop and get labs drawn after having BP check. Called patient who states she asked and was told lab was no longer here. States she was told at information desk. Advised patient that we do have lab in our office. Patient rescheduled appointment (lab) for Thursday March 9th at 3:30 pm.

## 2015-07-17 ENCOUNTER — Other Ambulatory Visit (INDEPENDENT_AMBULATORY_CARE_PROVIDER_SITE_OTHER): Payer: PRIVATE HEALTH INSURANCE

## 2015-07-17 DIAGNOSIS — R635 Abnormal weight gain: Secondary | ICD-10-CM

## 2015-07-17 IMAGING — US US THYROID BIOPSY
1 series · 14 of 14 positions shown · non-contrast
Comparison: none

CLINICAL DATA: Multiple thyroid nodules, largest left. Cold defect
on scintigraphy.

EXAM:
ULTRASOUND-GUIDED THYROID ASPIRATION BIOPSY
TECHNIQUE: The procedure, risks (including but not limited to bleeding,
infection, organ damage ), benefits, and alternatives were explained
to the patient. Questions regarding the procedure were encouraged
and answered. The patient understands and consents to the procedure.

[Series 1: us thyroid biopsy · 0.05mm/px · 14 acquisitions, 14 frames shown]
[im 1/14]
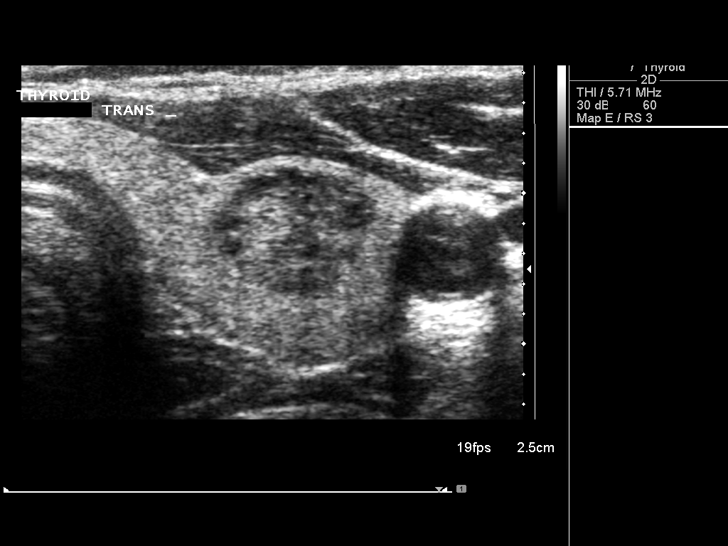
[im 2/14]
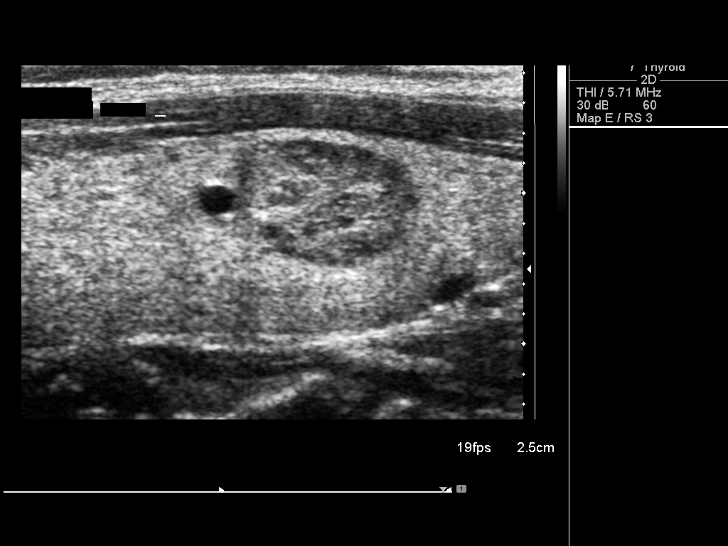
[im 3/14]
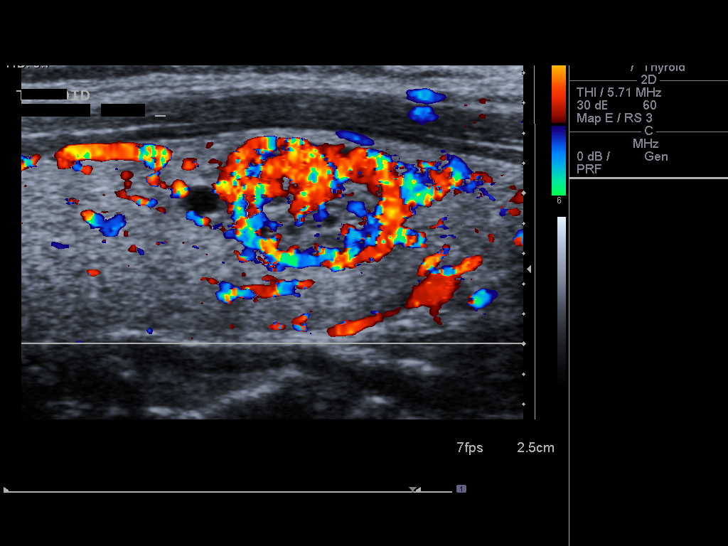
[im 4/14]
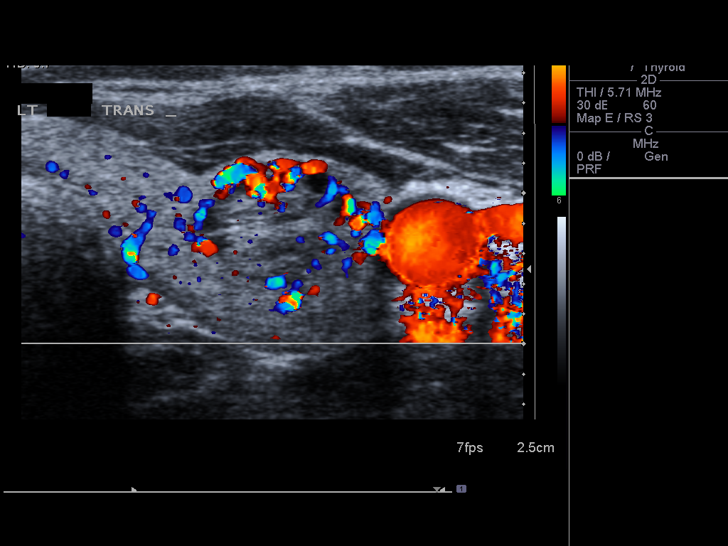
[im 5/14]
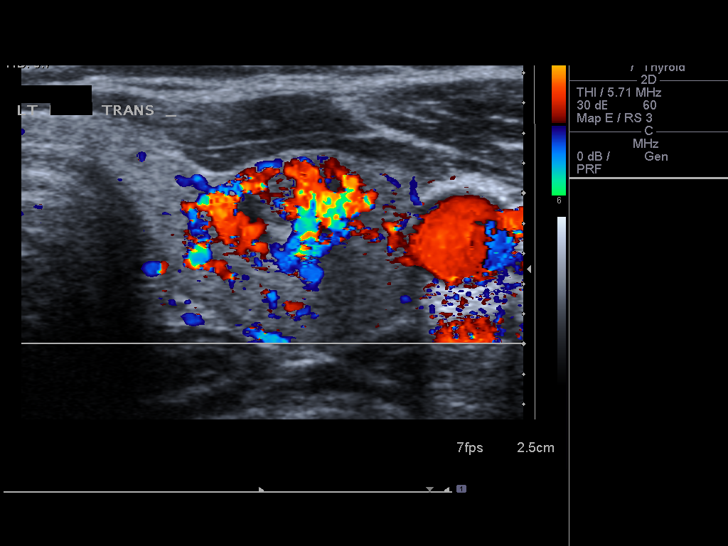
[im 6/14]
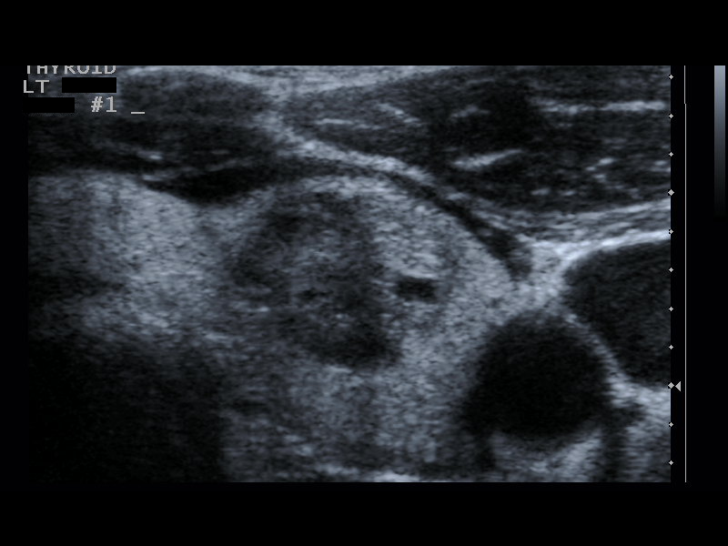
[im 7/14]
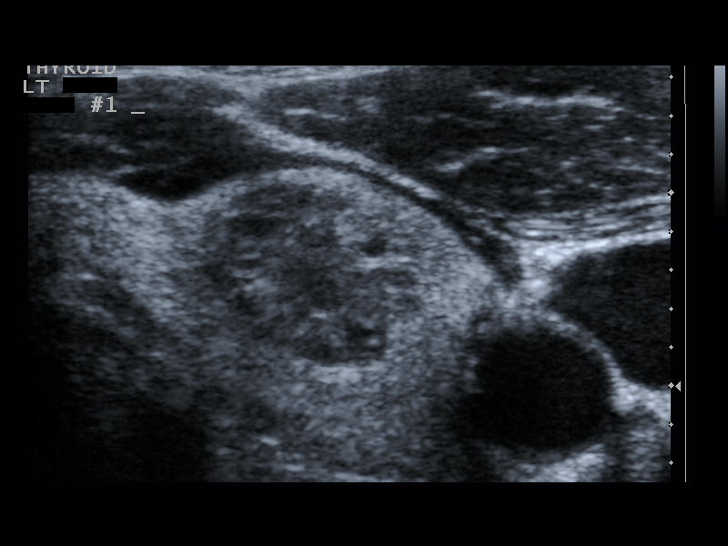
[im 8/14]
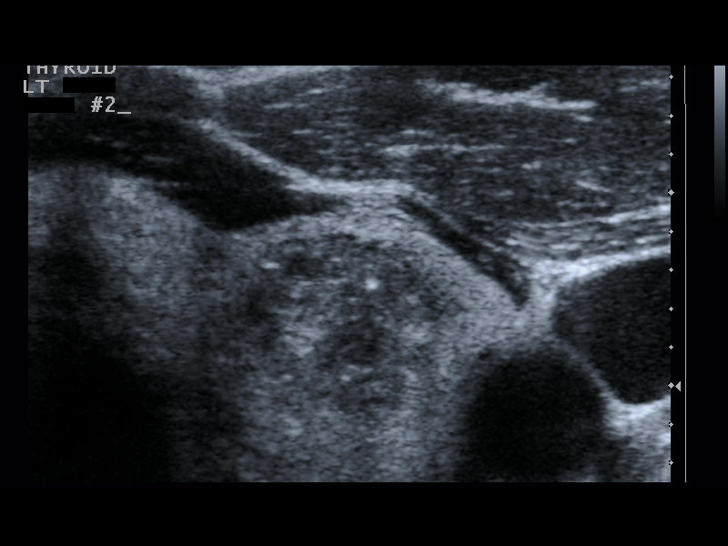
[im 9/14]
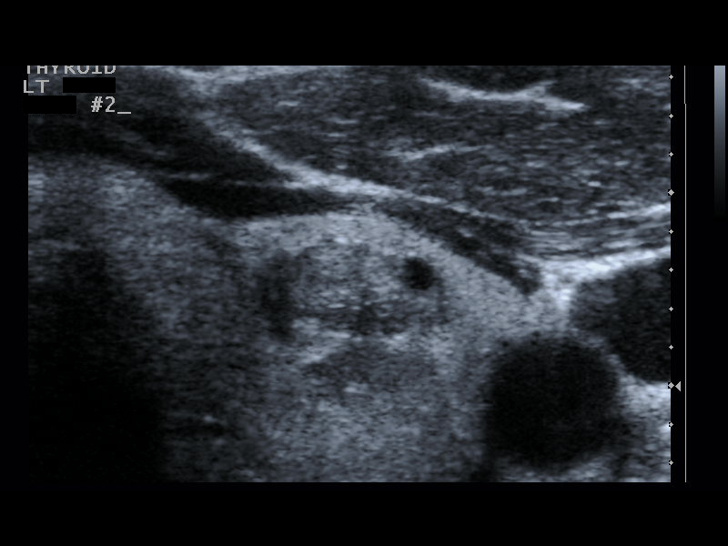
[im 10/14]
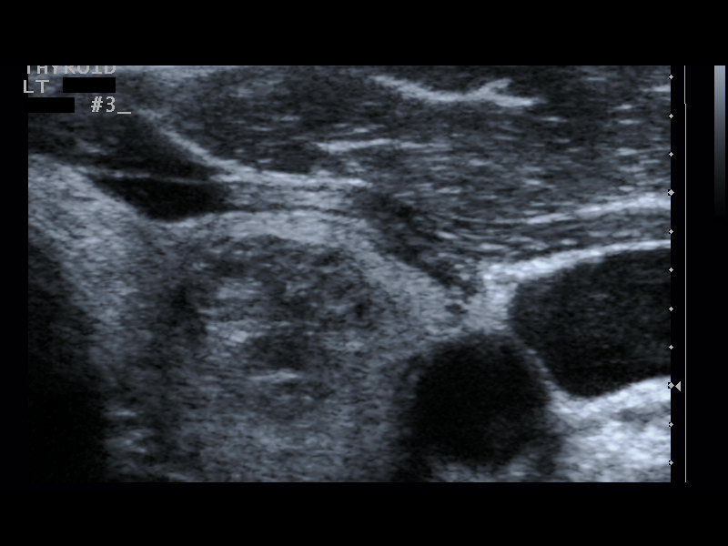
[im 11/14]
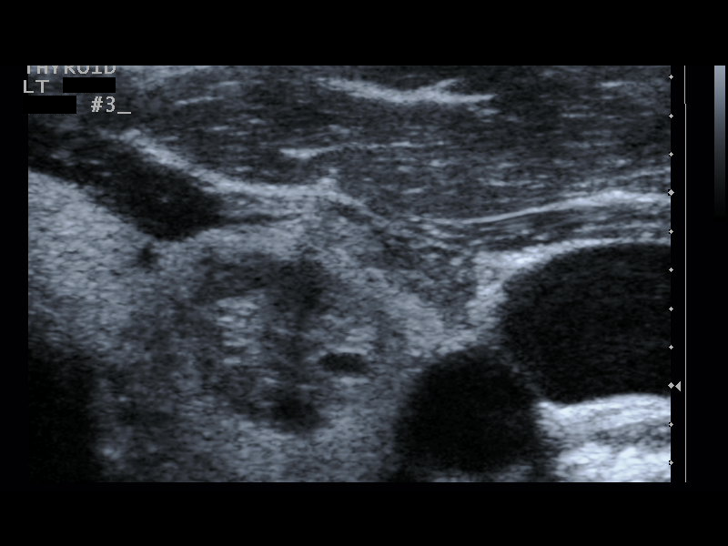
[im 12/14]
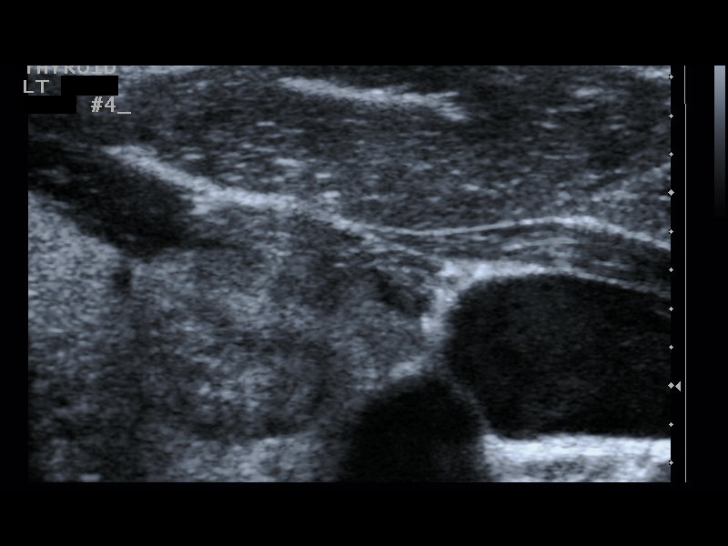
[im 13/14]
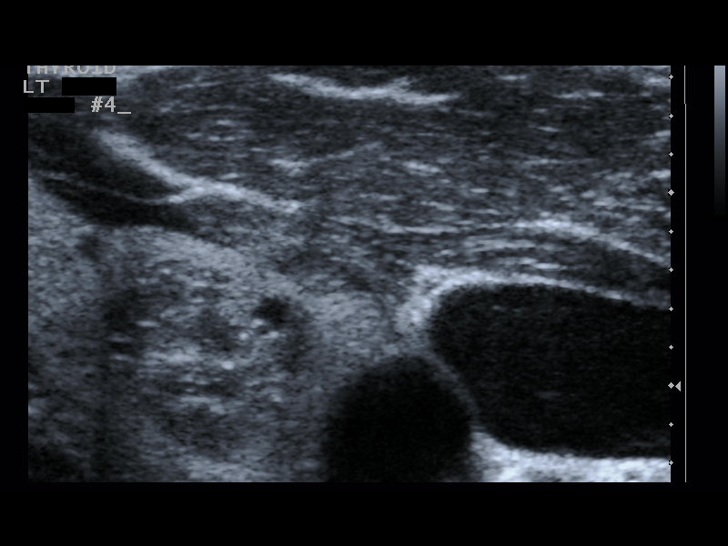
[im 14/14]
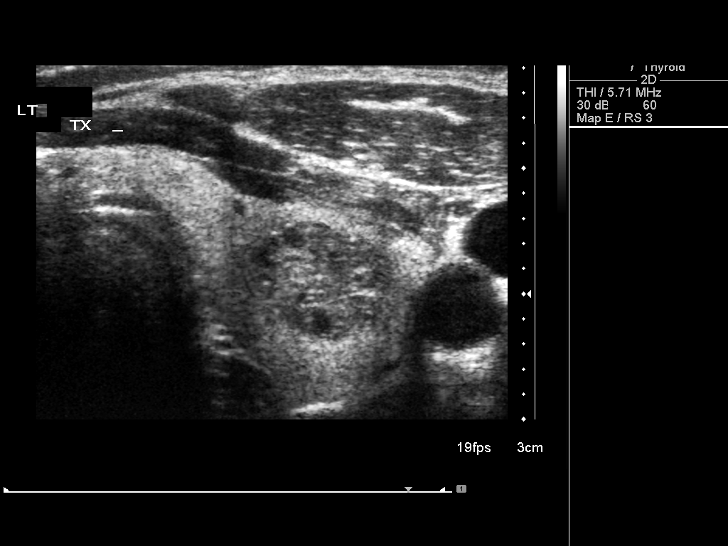

[14 of 14 positions shown; findings below may reference images not displayed]

Survey ultrasound was performed and the dominant lesion in the mid
left lobe was localized. An appropriate skin entry site was
determined. Skin was marked, then prepped with Betadine, draped in
usual sterile fashion, and infiltrated locally with 1% lidocaine.
Under real-time ultrasound guidance, 4 passes were made into the
lesion with 25 gauge needles. The patient tolerated procedure well,
with no immediate complications.
IMPRESSION: 1. Technically successful ultrasound-guided thyroid aspiration
biopsy , dominant left nodule.

## 2015-07-17 MED FILL — ALPRAZolam 0.5 MG TABS: 0.5 | 20 days supply | Qty: 20 | Fill #0

## 2015-07-18 LAB — TSH: TSH: 0.62 u[IU]/mL (ref 0.35–4.50)

## 2015-08-25 ENCOUNTER — Encounter: Payer: Self-pay | Admitting: Family

## 2015-08-25 ENCOUNTER — Ambulatory Visit (INDEPENDENT_AMBULATORY_CARE_PROVIDER_SITE_OTHER): Payer: PRIVATE HEALTH INSURANCE | Admitting: Family

## 2015-08-25 VITALS — BP 130/78 | HR 58 | Temp 98.3°F | Resp 16 | Ht 63.0 in | Wt 152.8 lb

## 2015-08-25 DIAGNOSIS — Z0001 Encounter for general adult medical examination with abnormal findings: Secondary | ICD-10-CM

## 2015-08-25 DIAGNOSIS — F419 Anxiety disorder, unspecified: Secondary | ICD-10-CM | POA: Diagnosis not present

## 2015-08-25 DIAGNOSIS — K589 Irritable bowel syndrome without diarrhea: Secondary | ICD-10-CM

## 2015-08-25 DIAGNOSIS — Z0000 Encounter for general adult medical examination without abnormal findings: Secondary | ICD-10-CM | POA: Insufficient documentation

## 2015-08-25 DIAGNOSIS — N951 Menopausal and female climacteric states: Secondary | ICD-10-CM | POA: Diagnosis not present

## 2015-08-25 DIAGNOSIS — E876 Hypokalemia: Secondary | ICD-10-CM

## 2015-08-25 DIAGNOSIS — R232 Flushing: Secondary | ICD-10-CM

## 2015-08-25 MED ORDER — LINACLOTIDE 290 MCG PO CAPS: 290.0000 ug | ORAL_CAPSULE | Freq: Every day | ORAL | Status: DC

## 2015-08-25 MED ORDER — ALPRAZOLAM 0.5 MG PO TABS: ORAL_TABLET | ORAL | Status: DC

## 2015-08-25 NOTE — Progress Notes (Signed)
Subjective:    Patient ID: Doristine JohnsJessie Hepner, female    DOB: [birth year], 55 y.o.   MRN: [id number]  HPI  [name] is a 55 yr old female who presents today for cpx.  Immunizations: tdap up to date Diet: needs improvement Exercise: not exercising, enjoys walking.   Colonoscopy: 2013- benign polyps per patient.  Pap Smear: hysterectomy Mammogram: 5/16- will do next month at [facility]  Reports 2 year history of hot flashes. Not sleeping well due to the hot flashes.  Takes Advil PM.   Constipation- chronic, "all my life." "If I don't take something I won't go."  Review of Systems  Constitutional: Negative for unexpected weight change.  HENT: Negative for rhinorrhea.   Eyes: Negative for visual disturbance.  Respiratory: Negative for cough.   Gastrointestinal:       Reports chronic constipation.    Genitourinary: Negative for dysuria and frequency.  Musculoskeletal: Positive for myalgias.       Arthritis pain in hands, knees, back  Neurological: Negative for headaches.  Hematological: Negative for adenopathy.  Psychiatric/Behavioral:       Denies depression.  Anxiety is well controlled with occasional use of xanax   Past Medical History  Diagnosis Date  . Anxiety   . Depression   . Arthritis   . Hypertension   . HSV-2 infection   . Thyroid nodule     Dr Elvera LennoxGherghe     Social History   Social History  . Marital Status: Divorced    Spouse Name: N/A  . Number of Children: N/A  . Years of Education: N/A   Occupational History  . Not on file.   Social History Main Topics  . Smoking status: Former Smoker    Types: Cigarettes  . Smokeless tobacco: Never Used  . Alcohol Use: 4.2 oz/week    7 Glasses of wine per week  . Drug Use: No  . Sexual Activity: Yes   Other Topics Concern  . 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    Enjoys  reading and spending time with grandson        Past Surgical History  Procedure Laterality Date  . Abdominal hysterectomy    . Hernia repair    . Breast lumpectomy Right 2012?  Marland Kitchen. Breast biopsy Left ?  . Biopsy thyroid  05/10/13    Family History  Problem Relation Age of Onset  . Family history unknown: Yes    No Known Allergies  Current Outpatient Prescriptions on File Prior to Visit  Medication Sig Dispense Refill  . ALPRAZolam (XANAX) 0.5 MG tablet TAKE 1 TABLET(S) BY MOUTH DAILY AS NEEDED FOR ANXIETY 20 tablet 0  . amLODipine (NORVASC) 5 MG tablet Take 1 tablet (5 mg total) by mouth daily. 30 tablet 3  . calcium carbonate 200 MG capsule Take 500 mg by mouth 2 (two) times daily with a meal.    . cetirizine (ZYRTEC) 10 MG tablet Take 10 mg by mouth as needed.     . cholecalciferol (VITAMIN D) 1000 UNITS tablet Take 1,000 Units by mouth daily.    . fish oil-omega-3 fatty acids 1000 MG capsule Take 2 g by mouth daily.    . Lactobacillus (ACIDOPHILUS) TABS Take 1 tablet by mouth daily.    Marland Kitchen. losartan-hydrochlorothiazide (HYZAAR) 100-25 MG per tablet  Take 1 tablet by mouth daily. 90 tablet 1  . mometasone (ELOCON) 0.1 % cream Apply topically daily. 45 g 0  . ranitidine (ZANTAC) 150 MG tablet Take 150 mg by mouth 2 (two) times daily as needed.     . vitamin C (ASCORBIC ACID) 500 MG tablet Take 500 mg by mouth daily.     No current facility-administered medications on file prior to visit.    BP 130/78 mmHg  Pulse 58  Temp(Src) 98.3 F (36.8 C) (Oral)  Resp 16  Ht  (1.6 m)  Wt 152 lb 12.8 oz (69.31 kg)  BMI 27.07 kg/m2  SpO2 100%        Objective:   Physical Exam  Physical Exam  Constitutional: She is oriented to person, place, and time. She appears well-developed and well-nourished. No distress.  HENT:  Head: Normocephalic and atraumatic.  Right Ear: Tympanic membrane and ear canal normal.  Left Ear: Tympanic membrane and ear canal normal.  Mouth/Throat:  Oropharynx is clear and moist.  Eyes: Pupils are equal, round, and reactive to light. No scleral icterus.  Neck: Normal range of motion. No thyromegaly present.  Cardiovascular: Normal rate and regular rhythm.   No murmur heard. Pulmonary/Chest: Effort normal and breath sounds normal. No respiratory distress. He has no wheezes. She has no rales. She exhibits no tenderness.  Abdominal: Soft. Bowel sounds are normal. He exhibits no distension and no mass. There is no tenderness. There is no rebound and no guarding.  Musculoskeletal: She exhibits no edema.  Lymphadenopathy:    She has no cervical adenopathy.  Neurological: She is alert and oriented to person, place, and time. She has normal reflexes. She exhibits normal muscle tone. Coordination normal.  Skin: Skin is warm and dry.  Psychiatric: She has a normal mood and affect. Her behavior is normal. Judgment and thought content normal.  Breasts: Examined lying Right: Without masses, retractions, discharge or axillary adenopathy.  Left: Without masses, retractions, discharge or axillary adenopathy.  Pelvic: deferred      Assessment & Plan:         Assessment & Plan:  EKG tracing is personally reviewed.  EKG notes NSR.  No acute changes.

## 2015-08-25 NOTE — Patient Instructions (Addendum)
Please work on [employer]healthy diet, and exercise. Schedule [facility] at [facility] for [facility]. Start linzess for your chronic constipation.

## 2015-08-25 NOTE — Progress Notes (Signed)
Pre visit review using our clinic review tool, if applicable. No additional management support is needed unless otherwise documented below in the visit note. 

## 2015-08-25 NOTE — Assessment & Plan Note (Addendum)
Discussed healthy diet, exercise weight loss.  Obtain routine labs including estradiol.  She does not wish to purse HRT if she is in menopause.

## 2015-08-25 NOTE — Assessment & Plan Note (Signed)
Trial of linzess. 

## 2015-08-26 LAB — BASIC METABOLIC PANEL
BUN: 10 mg/dL (ref 6–23)
CALCIUM: 10.3 mg/dL (ref 8.4–10.5)
CHLORIDE: 99 meq/L (ref 96–112)
CO2: 35 mEq/L — ABNORMAL HIGH (ref 19–32)
CREATININE: 0.64 mg/dL (ref 0.40–1.20)
GFR: 103.38 mL/min (ref >60.00)
Glucose, Bld: 101 mg/dL — ABNORMAL HIGH (ref 70–99)
Potassium: 3.4 mEq/L — ABNORMAL LOW (ref 3.5–5.1)
SODIUM: 140 meq/L (ref 135–145)

## 2015-08-26 LAB — LIPID PANEL
CHOLESTEROL: 198 mg/dL (ref 0–200)
HDL: 87.4 mg/dL (ref >39.00)
LDL Cholesterol: 100 mg/dL — ABNORMAL HIGH (ref 0–99)
NonHDL: 111.09
TRIGLYCERIDES: 57 mg/dL (ref 0.0–149.0)
Total CHOL/HDL Ratio: 2
VLDL: 11.4 mg/dL (ref 0.0–40.0)

## 2015-08-26 LAB — CBC WITH DIFFERENTIAL/PLATELET
BASOS ABS: 0 10*3/uL (ref 0.0–0.1)
Basophils Relative: 0.5 % (ref 0.0–3.0)
EOS ABS: 0.1 10*3/uL (ref 0.0–0.7)
Eosinophils Relative: 0.8 % (ref 0.0–5.0)
HCT: 39.3 % (ref 36.0–46.0)
Hemoglobin: 12.8 g/dL (ref 12.0–15.0)
LYMPHS ABS: 2.4 10*3/uL (ref 0.7–4.0)
Lymphocytes Relative: 31.7 % (ref 12.0–46.0)
MCHC: 32.6 g/dL (ref 30.0–36.0)
MCV: 75.6 fl — ABNORMAL LOW (ref 78.0–100.0)
MONO ABS: 0.6 10*3/uL (ref 0.1–1.0)
Monocytes Relative: 8.1 % (ref 3.0–12.0)
NEUTROS ABS: 4.5 10*3/uL (ref 1.4–7.7)
NEUTROS PCT: 58.9 % (ref 43.0–77.0)
PLATELETS: 261 10*3/uL (ref 150.0–400.0)
RBC: 5.2 Mil/uL — ABNORMAL HIGH (ref 3.87–5.11)
RDW: 15.5 % (ref 11.5–15.5)
WBC: 7.6 10*3/uL (ref 4.0–10.5)

## 2015-08-26 LAB — HEPATIC FUNCTION PANEL
ALBUMIN: 4.4 g/dL (ref 3.5–5.2)
ALK PHOS: 63 U/L (ref 39–117)
ALT: 19 U/L (ref 0–35)
AST: 21 U/L (ref 0–37)
BILIRUBIN DIRECT: 0.1 mg/dL (ref 0.0–0.3)
TOTAL PROTEIN: 7.6 g/dL (ref 6.0–8.3)
Total Bilirubin: 0.4 mg/dL (ref 0.2–1.2)

## 2015-09-01 ENCOUNTER — Encounter: Payer: Self-pay | Admitting: Family

## 2015-09-01 NOTE — Addendum Note (Signed)
Addended by: Harley AltoPRICE, KRISTY M on: 09/01/2015 11:30 AM   Modules accepted: Orders

## 2015-09-03 LAB — ESTRADIOL, FREE
ESTRADIOL FREE: 0.1 pg/mL
Estradiol: 8 pg/mL

## 2015-09-04 ENCOUNTER — Encounter: Payer: Self-pay | Admitting: Family

## 2015-09-04 MED ORDER — POTASSIUM CHLORIDE CRYS ER 20 MEQ PO TBCR: 20.0000 meq | EXTENDED_RELEASE_TABLET | Freq: Every day | ORAL | Status: DC

## 2015-09-04 NOTE — Addendum Note (Signed)
Addended by: Starla LinkAKLEY, Jermery Caratachea J on: 09/04/2015 02:49 PM   Modules accepted: Orders, SmartSet

## 2015-09-12 ENCOUNTER — Other Ambulatory Visit (INDEPENDENT_AMBULATORY_CARE_PROVIDER_SITE_OTHER): Payer: PRIVATE HEALTH INSURANCE

## 2015-09-12 ENCOUNTER — Other Ambulatory Visit: Payer: Self-pay | Admitting: Family

## 2015-09-12 ENCOUNTER — Encounter: Payer: Self-pay | Admitting: Family

## 2015-09-12 DIAGNOSIS — Z0000 Encounter for general adult medical examination without abnormal findings: Secondary | ICD-10-CM

## 2015-09-12 DIAGNOSIS — N951 Menopausal and female climacteric states: Secondary | ICD-10-CM | POA: Diagnosis not present

## 2015-09-12 DIAGNOSIS — R232 Flushing: Secondary | ICD-10-CM

## 2015-09-12 DIAGNOSIS — K589 Irritable bowel syndrome without diarrhea: Secondary | ICD-10-CM | POA: Diagnosis not present

## 2015-09-12 DIAGNOSIS — F419 Anxiety disorder, unspecified: Secondary | ICD-10-CM | POA: Diagnosis not present

## 2015-09-12 DIAGNOSIS — E876 Hypokalemia: Secondary | ICD-10-CM | POA: Diagnosis not present

## 2015-09-12 LAB — URINALYSIS, ROUTINE W REFLEX MICROSCOPIC
Bilirubin Urine: NEGATIVE
KETONES UR: NEGATIVE
Nitrite: NEGATIVE
Specific Gravity, Urine: 1.025 (ref 1.000–1.030)
TOTAL PROTEIN, URINE-UPE24: NEGATIVE
Urine Glucose: NEGATIVE
Urobilinogen, UA: 0.2 (ref 0.0–1.0)
pH: 6 (ref 5.0–8.0)

## 2015-09-12 LAB — BASIC METABOLIC PANEL
BUN: 13 mg/dL (ref 6–23)
CALCIUM: 9.6 mg/dL (ref 8.4–10.5)
CO2: 32 meq/L (ref 19–32)
Chloride: 101 mEq/L (ref 96–112)
Creatinine, Ser: 0.64 mg/dL (ref 0.40–1.20)
GFR: 103.37 mL/min (ref >60.00)
GLUCOSE: 91 mg/dL (ref 70–99)
POTASSIUM: 4 meq/L (ref 3.5–5.1)
SODIUM: 141 meq/L (ref 135–145)

## 2015-09-12 MED ORDER — POTASSIUM CHLORIDE CRYS ER 20 MEQ PO TBCR: 20.0000 meq | EXTENDED_RELEASE_TABLET | Freq: Every day | ORAL | Status: DC

## 2015-09-16 ENCOUNTER — Ambulatory Visit (INDEPENDENT_AMBULATORY_CARE_PROVIDER_SITE_OTHER): Payer: PRIVATE HEALTH INSURANCE | Admitting: Internal Medicine

## 2015-09-16 ENCOUNTER — Encounter: Payer: Self-pay | Admitting: Internal Medicine

## 2015-09-16 VITALS — BP 112/68 | HR 77 | Wt 152.0 lb

## 2015-09-16 DIAGNOSIS — E042 Nontoxic multinodular goiter: Secondary | ICD-10-CM

## 2015-09-16 NOTE — Patient Instructions (Signed)
We will let you know about the schedule for the thyroid U/S.  Please come back for another visit in 1.5 years.

## 2015-09-16 NOTE — Progress Notes (Signed)
Patient ID: Anna Werner, female   DOB: December 26, 1960, 55 y.o.   MRN: [id number]   HPI  Anna JohnsJessie Shorey is a 55 y.o.-year-old female, returning for f/u for MNG. Last visit 1 year ago.  Reviewed and addended hx: She was found to have 1 thyroid nodule in 2012, but had multiple nodules on thyroid U/S in 12/2012: Two nodules are larger, 1 cm diameter each, mixed, without internal calcifications but with internal blood flow - 1 in left and 1 in right lobe   An uptake and scan (02/2013): Heterogeneous thyroid uptake with small warm nodule on the right and possible small cold nodule on the left.  Last thyroid U/S in 01/2014 showed several nodules - largest 1.5 cm in L lobe - corresponding to the cold defect on the Uptake and scan.  FNA of this nodule in 01/2014: benign  Pt denies feeling nodules in neck, hoarseness, dysphagia/odynophagia, SOB with lying down, has a vague discomfort in right side of neck.  I reviewed pt's thyroid tests: Lab Results  Component Value Date   TSH 0.62 07/17/2015   TSH 0.70 04/22/2014   TSH 0.26* 01/15/2014   TSH 0.647 06/19/2013   TSH 0.958 12/13/2012   TSH 1.037 01/12/2012   FREET4 0.90 04/22/2014   FREET4 0.83 01/15/2014    Pt mentions: - no heat intolerance  - no tremors  - no anxiety/depression - no palpitations - no fatigue - no diarrhea/constipation - no weight gain - no dry skin  I reviewed pt's medications, allergies, PMH, social hx, family hx, and changes were documented in the history of present illness. Otherwise, unchanged from my initial visit note. Started potassium since last visit.   ROS: Constitutional: see HPI Eyes: no blurry vision, no xerophthalmia ENT: no sore throat, no nodules palpated in throat, no dysphagia/odynophagia, no hoarseness Cardiovascular: no CP/SOB/palpitations/leg swelling Respiratory: no cough/SOB Gastrointestinal: no N/V/D/C Musculoskeletal: no muscle/no joint aches Skin: no rashes Neurological: no  tremors/numbness/tingling/dizziness  PE: BP 112/68 mmHg  Pulse 77  Wt 152 lb (68.947 kg)  SpO2 99% Body mass index is 26.93 kg/(m^2). Wt Readings from Last 3 Encounters:  09/16/15 152 lb (68.947 kg)  08/25/15 152 lb 12.8 oz (69.31 kg)  06/30/15 149 lb (67.586 kg)   Constitutional: slightly overweight, in NAD Eyes: PERRLA, EOMI, no exophthalmos ENT: moist mucous membranes, + slight thyromegaly - lumpy-bumpy at palpation, no cervical lymphadenopathy Cardiovascular: RRR, No MRG Respiratory: CTA B Gastrointestinal: abdomen soft, NT, ND, BS+ Musculoskeletal: no deformities, strength intact in all 4;  Skin: moist, warm, no rashes Neurological: no tremor with outstretched hands, DTR normal in all 4  ASSESSMENT: 1. MNG - thyroid U/S (12/13/2012):  Right thyroid lobe: Measures 7.5 x 1.9 x 1.9 cm   Left thyroid lobe: Measures 6.8 x 1.7 by 1.6 cm.   Isthmus: Measures 3 mm thickness.   Focal nodules: A nodule in the mid to superior aspect of the right lobe measures 1 x 0.5 x 0.7 cm. At least three additional smaller nodules are noted in the right lobe. A heterogeneous nodule inferior aspect of the left lobe measures 9 x 10 mm. A tiny nodule is noted in the mid aspect of the left lobe.   Lymphadenopathy: None visualized.  - 02/07/2014: thyroid Scan: Heterogeneous thyroid uptake with small warm nodule on the right and possible small cold nodule on the left.  - 01/28/2014: Thyroid U/S:  Right thyroid lobe: 6.9 cm x 1.7 cm x 1.9 cm. Four separate lesions of the right thyroid lobe,  with relatively heterogeneous echotexture of the intervening parenchyma.  - Dominant lesion within the right mid thyroid lobe measuring 1.1 cm x 5 mm x 9 mm.  - More superiorly there is a lesion measuring 5 mm x 2 mm.  - Medially solid lesion measures 5 mm x 3 mm x 4 mm.  - At the inferior aspect a lesion measures 6 mm x 6 mm x 7 mm.  Left thyroid lobe: 6.1 cm x 1.6 cm x 1.8 cm.  - Dominant nodule the left  thyroid lobe measures 1.5 cm x 1.0 cm x 1.1 cm.  - A lesion more medially measures 9 mm x 5 mm x 6 mm.  The intervening parenchyma is relatively homogeneous in echotexture.  Isthmus Thickness: 3-4 mm. No nodules visualized.  Lymphadenopathy: None visualized.  - 02/05/2014: FNA of the 1.4 cm L nodule: BENIGN. FINDINGS CONSISTENT WITH NON-NEOPLASTIC GOITER AND CONSISTENT WITH THE CONTENTS OF A CYST.  PLAN: 1. MNG  - we reviewed the results of the 01/2014 thyroid ultrasound and the uptake and scan along with the pt. She has 2 dominant 1 cm nodules, one in each lobes. Per the Uptake and scan, the R nodule is warm and the L nodule is cold >> this nodule was Bx'ed >> benign. We discussed to obtain another thyroid U/S and, if the L nodule has grown, I would suggest a Bx. The warm nodule has a very low risk of cancer, so no Bx needed, but would like to follow its size. - she agrees with the plan - reviewed recent TFTs: normal - I'll see her back in 1.5 years - I will send her the results through MyChart  - Thyroid U/S (09/22/2015):  Right thyroid lobe : 7.2 cm x 1.4 cm x 2.2 cm. Heterogeneous appearance of the thyroid. - The largest nodule is inferior measuring 9 mm x 9 mm x 1.0 cm. This is essentially unchanged in size, though demonstrates internal cystic degeneration compared to the prior ultrasound of 01/28/2014. - Superior nodule measures 6 mm and mid right nodule measures 9 mm.  Left thyroid lobe: 5.3 cm x 1.5 cm x 2.0 cm. Heterogeneous appearance of the left thyroid tissue. - Nodule previously biopsied measures 1.1 cm x 1.0 cm x 1.6 cm (previously measuring 1.5 cm x 1.0 cm x 1.1 cm). - Additional left-sided nodule measures 6 mm.  Isthmus Thickness: 4 mm. 6 mm isthmic hypoechoic nodule  Lymphadenopathy: None visualized.  IMPRESSION: Heterogeneous and multinodular thyroid. The only nodule which meets criteria for biopsy is within the left thyroid, and has been previously biopsied  01/28/2014, unchanged since that date. Recommend correlation prior biopsy results. Remainder of the nodules do not meet criteria for biopsy.    No worrisome findings. The nodule that was previously biopsied appears stable. The rest of the nodules are mostly cystic.

## 2015-09-22 ENCOUNTER — Ambulatory Visit
Admission: RE | Admit: 2015-09-22 | Discharge: 2015-09-22 | Disposition: A | Discharge status: Home / Self Care | Payer: PRIVATE HEALTH INSURANCE | Source: Ambulatory Visit | Attending: Internal Medicine | Admitting: Internal Medicine

## 2015-10-08 ENCOUNTER — Encounter: Payer: Self-pay | Admitting: Family

## 2015-10-30 ENCOUNTER — Telehealth: Payer: Self-pay | Admitting: Family

## 2015-10-30 DIAGNOSIS — F419 Anxiety disorder, unspecified: Secondary | ICD-10-CM

## 2015-11-04 MED ORDER — ALPRAZOLAM 0.5 MG PO TABS: ORAL_TABLET | ORAL | Status: DC

## 2015-11-04 MED ORDER — AMLODIPINE BESYLATE 5 MG PO TABS: 5.0000 mg | ORAL_TABLET | Freq: Every day | ORAL | Status: DC

## 2015-11-04 NOTE — Addendum Note (Signed)
Addended by: Mervin KungFERGERSON, Marvelous Woolford A on: 11/04/2015 11:43 AM   Modules accepted: Orders

## 2015-11-04 NOTE — Telephone Encounter (Signed)
Last alprazolam Rx: 08/25/15, #20 UDS: none on file Next OV: 11/24/15  Rx printed and forwarded to PCP for signature. Will obtain UDS at next OV.

## 2015-11-04 NOTE — Telephone Encounter (Signed)
Alprazolam rx faxed to pharmacy at 12pm. Message sent to pt.

## 2015-11-24 ENCOUNTER — Ambulatory Visit: Payer: PRIVATE HEALTH INSURANCE | Admitting: Family

## 2015-12-04 ENCOUNTER — Other Ambulatory Visit: Payer: Self-pay | Admitting: Family

## 2015-12-25 ENCOUNTER — Other Ambulatory Visit: Payer: Self-pay | Admitting: Family

## 2015-12-26 ENCOUNTER — Other Ambulatory Visit: Payer: Self-pay | Admitting: *Deleted

## 2015-12-26 DIAGNOSIS — F419 Anxiety disorder, unspecified: Secondary | ICD-10-CM

## 2015-12-26 NOTE — Telephone Encounter (Signed)
Received fax request for alprazolam 0.5mg  daily as needed.  Last Rx: 11/04/15, #20 UDS: none on file Next OV: due 11/2015 and pt is pas due.  Please advise request?

## 2015-12-28 NOTE — Telephone Encounter (Signed)
Ok to send refill  

## 2015-12-29 ENCOUNTER — Other Ambulatory Visit: Payer: Self-pay | Admitting: Family

## 2015-12-29 DIAGNOSIS — F419 Anxiety disorder, unspecified: Secondary | ICD-10-CM

## 2015-12-29 MED ORDER — ALPRAZOLAM 0.5 MG PO TABS: ORAL_TABLET | ORAL | 0 refills | Status: DC

## 2015-12-29 NOTE — Telephone Encounter (Signed)
Rx phoned in to the pharmacy.     KP 

## 2016-02-05 ENCOUNTER — Other Ambulatory Visit: Payer: Self-pay | Admitting: Family

## 2016-02-24 ENCOUNTER — Telehealth: Payer: Self-pay | Admitting: *Deleted

## 2016-02-24 NOTE — Telephone Encounter (Signed)
[facility] from [facility] requesting alprazolam refill.  Last OV: 08/2015 and advised 3 month f/u. Pt cancelled. Next OV:  Past due UDS:  Not previously collected Last RX: 12/29/15, #20.  Please advise request and follow up?

## 2016-02-25 NOTE — Telephone Encounter (Signed)
Shiquita-- Alprazolam refill has been denied until pt can be seen in the office since she is past due for follow up. Please call pt to schedule appointment. Thanks!

## 2016-02-25 NOTE — Telephone Encounter (Signed)
Needs OV please.

## 2016-02-26 NOTE — Telephone Encounter (Signed)
Called pt. lvm informing her to call us back to schedule a F/U  appt with PCP for medication

## 2016-03-02 NOTE — Telephone Encounter (Signed)
Mychart message sent to pt and denial faxed to pharmacy.

## 2016-03-15 ENCOUNTER — Encounter: Payer: Self-pay | Admitting: Family

## 2016-03-15 ENCOUNTER — Ambulatory Visit (INDEPENDENT_AMBULATORY_CARE_PROVIDER_SITE_OTHER): Payer: PRIVATE HEALTH INSURANCE | Admitting: Family

## 2016-03-15 VITALS — BP 133/72 | HR 65 | Temp 97.7°F | Resp 16 | Ht 63.0 in | Wt 156.6 lb

## 2016-03-15 DIAGNOSIS — K219 Gastro-esophageal reflux disease without esophagitis: Secondary | ICD-10-CM

## 2016-03-15 DIAGNOSIS — F419 Anxiety disorder, unspecified: Secondary | ICD-10-CM

## 2016-03-15 DIAGNOSIS — I10 Essential (primary) hypertension: Secondary | ICD-10-CM

## 2016-03-15 DIAGNOSIS — G4700 Insomnia, unspecified: Secondary | ICD-10-CM | POA: Diagnosis not present

## 2016-03-15 DIAGNOSIS — N951 Menopausal and female climacteric states: Secondary | ICD-10-CM | POA: Diagnosis not present

## 2016-03-15 MED ORDER — LOSARTAN POTASSIUM-HCTZ 100-25 MG PO TABS: 1.0000 | ORAL_TABLET | Freq: Every day | ORAL | 1 refills | Status: DC

## 2016-03-15 MED ORDER — AMLODIPINE BESYLATE 5 MG PO TABS: 5.0000 mg | ORAL_TABLET | Freq: Every day | ORAL | 1 refills | Status: DC

## 2016-03-15 MED ORDER — ALPRAZOLAM 0.5 MG PO TABS: ORAL_TABLET | ORAL | 0 refills | Status: DC

## 2016-03-15 MED ORDER — POTASSIUM CHLORIDE CRYS ER 20 MEQ PO TBCR: 20.0000 meq | EXTENDED_RELEASE_TABLET | Freq: Every day | ORAL | 1 refills | Status: DC

## 2016-03-15 MED FILL — ALPRAZolam 0.5 MG TABS: 0.5 | 30 days supply | Qty: 30 | Fill #0

## 2016-03-15 NOTE — Progress Notes (Signed)
Pre visit review using our clinic review tool, if applicable. No additional management support is needed unless otherwise documented below in the visit note. 

## 2016-03-15 NOTE — Assessment & Plan Note (Signed)
Continue prn xanax 

## 2016-03-15 NOTE — Patient Instructions (Signed)
Please complete lab work prior to leaving.   

## 2016-03-15 NOTE — Assessment & Plan Note (Signed)
Discussed trial of effexor, she declines at this time.

## 2016-03-15 NOTE — Assessment & Plan Note (Signed)
Stable with rare use of xanax for panic attacks. Obtain uds today.

## 2016-03-15 NOTE — Assessment & Plan Note (Addendum)
Stable on current medications, continue same. Obtain follow up bmet. 

## 2016-03-15 NOTE — Addendum Note (Signed)
Addended by: Mervin KungFERGERSON, Elizet Kaplan A on: 03/15/2016 04:10 PM   Modules accepted: Orders

## 2016-03-15 NOTE — Progress Notes (Signed)
Subjective:    Patient ID: Anna Werner, female    DOB: [birth year], 55 y.o.   MRN: [id number]  HPI  [name] is a 55 yr old female who presents today for follo wup.  1) Anxiety- She reports that she uses to help her sleep only some nights. Has rare panic attacks (1 or less a month).   2) Depression- Reports that here mood is fine, is having hot flashes, increased moodiness.   3) HTN- maintained on amlodipine, hyzaar.  Denies CP/SOB, swelling.  BP Readings from Last 3 Encounters:  03/15/16 133/72  09/16/15 112/68  08/25/15 130/78   4) GERD- maintained on zantac as needed which helps her symptoms. Uses about 1-2 times a month.   5) IBS- stopped linzess- did not help. Using MOM pills and senokot-S prn which helps most of the time.   Review of Systems See HPI  Past Medical History:  Diagnosis Date  . Anxiety   . Arthritis   . Depression   . HSV-2 infection   . Hypertension   . Thyroid nodule    Dr Elvera LennoxGherghe     Social History   Social History  . Marital status: Divorced    Spouse name: N/A  . Number of children: N/A  . Years of education: N/A   Occupational History  . Not on file.   Social History Main Topics  . Smoking status: Former Smoker    Types: Cigarettes  . Smokeless tobacco: Never Used  . Alcohol use 4.2 oz/week    7 Glasses of wine per week  . Drug use: No  . Sexual activity: Yes   Other Topics Concern  . 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    Enjoys reading and spending time with grandson    Lives alone       Past Surgical History:  Procedure Laterality Date  . ABDOMINAL HYSTERECTOMY    . BIOPSY THYROID  05/10/13  . BREAST BIOPSY Left ?  Marland Kitchen. BREAST LUMPECTOMY Right 2012?  . HERNIA REPAIR      Family History  Problem Relation Age of Onset  . Family history unknown: Yes    No Known  Allergies  Current Outpatient Prescriptions on File Prior to Visit  Medication Sig Dispense Refill  . ALPRAZolam (XANAX) 0.5 MG tablet TAKE 1 TABLET(S) BY MOUTH DAILY AS NEEDED FOR ANXIETY 20 tablet 0  . amLODipine (NORVASC) 5 MG tablet TAKE ONE TABLET BY MOUTH DAILY 30 tablet 1  . calcium carbonate 200 MG capsule Take 500 mg by mouth 2 (two) times daily with a meal.    . cetirizine (ZYRTEC) 10 MG tablet Take 10 mg by mouth as needed.     . cholecalciferol (VITAMIN D) 1000 UNITS tablet Take 1,000 Units by mouth daily.    . fish oil-omega-3 fatty acids 1000 MG capsule Take 2 g by mouth daily.    . Lactobacillus (ACIDOPHILUS) TABS Take 1 tablet by mouth daily.    Marland Kitchen. losartan-hydrochlorothiazide (HYZAAR) 100-25 MG per tablet Take 1 tablet by mouth daily. 90 tablet 1  . mometasone (ELOCON) 0.1 % cream Apply topically daily. 45 g 0  . potassium chloride SA (K-DUR,KLOR-CON) 20 MEQ tablet Take 1 tablet (20 mEq total) by mouth daily. 30 tablet 2  . ranitidine (ZANTAC) 150 MG tablet  Take 150 mg by mouth 2 (two) times daily as needed.     . vitamin C (ASCORBIC ACID) 500 MG tablet Take 500 mg by mouth daily.     No current facility-administered medications on file prior to visit.     BP 133/72 (BP Location: Right Arm, Patient Position: Sitting, Cuff Size: Normal)   Pulse 65   Temp 97.7 F (36.5 C) (Oral)   Resp 16   Ht 5\' 3"  (1.6 m)   Wt 156 lb 9.6 oz (71 kg)   SpO2 100% Comment: RA  BMI 27.74 kg/m       Objective:   Physical Exam  Constitutional: She is oriented to person, place, and time. She appears well-developed and well-nourished.  HENT:  Head: Normocephalic and atraumatic.  Cardiovascular: Normal rate, regular rhythm and normal heart sounds.   No murmur heard. Pulmonary/Chest: Effort normal and breath sounds normal. No respiratory distress. She has no wheezes.  Musculoskeletal: She exhibits no edema.  Neurological: She is alert and oriented to person, place, and time.   Psychiatric: She has a normal mood and affect. Her behavior is normal. Judgment and thought content normal.          Assessment & Plan:

## 2016-03-15 NOTE — Assessment & Plan Note (Signed)
Stable with occasional use of prn xantac.

## 2016-03-16 LAB — BASIC METABOLIC PANEL
BUN: 12 mg/dL (ref 6–23)
CALCIUM: 9.6 mg/dL (ref 8.4–10.5)
CO2: 32 meq/L (ref 19–32)
Chloride: 101 mEq/L (ref 96–112)
Creatinine, Ser: 0.67 mg/dL (ref 0.40–1.20)
GFR: 97.85 mL/min (ref >60.00)
GLUCOSE: 80 mg/dL (ref 70–99)
POTASSIUM: 3.7 meq/L (ref 3.5–5.1)
SODIUM: 140 meq/L (ref 135–145)

## 2016-05-18 ENCOUNTER — Other Ambulatory Visit: Payer: Self-pay | Admitting: Emergency Medicine

## 2016-05-18 DIAGNOSIS — F419 Anxiety disorder, unspecified: Secondary | ICD-10-CM

## 2016-05-18 MED ORDER — ALPRAZOLAM 0.5 MG PO TABS: ORAL_TABLET | ORAL | 0 refills | Status: DC

## 2016-05-18 NOTE — Telephone Encounter (Signed)
See rx. 

## 2016-05-18 NOTE — Telephone Encounter (Signed)
Rx faxed to pharmacy  

## 2016-05-18 NOTE — Telephone Encounter (Signed)
Received refill request for ALPRAZOLAM (XANAX) 0.5 MG tablet. Last office visit and refill 03/15/16. Is it ok to refill? Please advise.

## 2016-07-12 ENCOUNTER — Other Ambulatory Visit: Payer: Self-pay | Admitting: Family Medicine

## 2016-07-12 DIAGNOSIS — F419 Anxiety disorder, unspecified: Secondary | ICD-10-CM

## 2016-07-13 MED ORDER — ALPRAZOLAM 0.5 MG PO TABS: ORAL_TABLET | ORAL | 0 refills | Status: DC

## 2016-07-13 NOTE — Telephone Encounter (Signed)
Rx placed at front desk for pick up and mychart message sent to pt. 

## 2016-07-13 NOTE — Telephone Encounter (Signed)
OK 

## 2016-07-13 NOTE — Telephone Encounter (Signed)
No refills until seen in the office please.

## 2016-07-13 NOTE — Telephone Encounter (Signed)
Requesting:   alprazolam Contract    06/30/2015 UDS    Moderate next was due 06/15/2016 Last OV   08/25/2015---no upcoming scheduled appointment Last Refill  #30 with 0 refills on 05/18/2016  Please Advise

## 2016-07-13 NOTE — Telephone Encounter (Signed)
Denial sent to pharmacy. Upon further review. Pt was seen 03/2016 and advised f/u in May 2018. Rx printed and forwarded to PCP for signature. Pt will need to pick up Rx this time and provide UDS.

## 2016-07-20 MED FILL — ALPRAZolam 0.5 MG TABS: 0.5 | 30 days supply | Qty: 30 | Fill #0

## 2016-07-22 ENCOUNTER — Encounter: Payer: Self-pay | Admitting: Family

## 2016-09-16 ENCOUNTER — Telehealth: Payer: Self-pay | Admitting: *Deleted

## 2016-09-16 DIAGNOSIS — F419 Anxiety disorder, unspecified: Secondary | ICD-10-CM

## 2016-09-16 NOTE — Telephone Encounter (Signed)
Faxed refill request received from [facility] for Alprazolam 0.5 mg tab Last filled by MD on 07/12/16 Last UDS - 07/22/16 Last AEX - 03/15/16 Next AEX - 6-Mths; pt is due for F/U Please Advise on refills/SLS 05/10

## 2016-09-16 NOTE — Telephone Encounter (Signed)
OK to send refill but needs OV please.

## 2016-09-17 MED ORDER — ALPRAZOLAM 0.5 MG PO TABS: ORAL_TABLET | ORAL | 0 refills | Status: DC

## 2016-09-17 NOTE — Telephone Encounter (Signed)
lvm for pt to call back to schedule ov with PCP per PCP. Informed of refill.

## 2016-09-17 NOTE — Telephone Encounter (Signed)
Refill called to pharmacy voicemail. Please call pt to schedule appt with Melissa soon. Thanks!

## 2016-09-20 ENCOUNTER — Encounter: Payer: Self-pay | Admitting: Family

## 2016-09-21 MED ORDER — ALPRAZOLAM 0.5 MG PO TABS: ORAL_TABLET | ORAL | 0 refills | Status: DC

## 2016-09-21 MED FILL — ALPRAZolam 0.5 MG TABS: 0.5 | 30 days supply | Qty: 30 | Fill #0

## 2016-09-21 NOTE — Telephone Encounter (Addendum)
Received fax dated 09/21/16 "2nd request". Called and spoke with Erskine SquibbJane at [facility] and they do not have record of below authorization. Verbal given to Jane, #30 x no refills. It appears that below authorization was called to [facility] on 09/17/16. Called [facility] and cancelled previous Rx.

## 2016-09-21 NOTE — Addendum Note (Signed)
Addended by: Mervin KungFERGERSON, Herbert Aguinaldo A on: 09/21/2016 04:12 PM   Modules accepted: Orders

## 2016-09-29 ENCOUNTER — Telehealth: Payer: Self-pay | Admitting: Family

## 2016-09-29 MED ORDER — LOSARTAN POTASSIUM-HCTZ 100-25 MG PO TABS: 1.0000 | ORAL_TABLET | Freq: Every day | ORAL | 1 refills | Status: DC

## 2016-09-29 MED ORDER — LOSARTAN POTASSIUM-HCTZ 100-25 MG PO TABS
1.0000 | ORAL_TABLET | Freq: Every day | ORAL | 1 refills | Status: DC
Start: 2016-09-29 — End: 2016-09-29

## 2016-09-29 MED FILL — LOSARTAN-HCTZ 100-25 MG TAB: 100-25 | 90 days supply | Qty: 90 | Fill #0

## 2016-09-29 NOTE — Telephone Encounter (Signed)
Caller name:Demisha Goldwasser Relationship to patient: Can be reached:[phone] Pharmacy:[facility]  Reason for call:Requesting refill on losartan, has appt on 10/11/16 for CPE

## 2016-09-29 NOTE — Telephone Encounter (Signed)
Refill sent to [facility] in error and cancelled with pharmacist. Refill resent to [facility] pharmacy. Detailed message left on pt's voicemail re: refill completion.

## 2016-10-11 ENCOUNTER — Encounter: Payer: PRIVATE HEALTH INSURANCE | Admitting: Family

## 2016-10-11 ENCOUNTER — Telehealth: Payer: Self-pay | Admitting: Family

## 2016-10-12 ENCOUNTER — Telehealth: Payer: Self-pay | Admitting: Family

## 2016-10-12 MED ORDER — AMLODIPINE BESYLATE 5 MG PO TABS: 5.0000 mg | ORAL_TABLET | Freq: Every day | ORAL | 0 refills | Status: DC

## 2016-10-12 MED FILL — AMLODIPINE BESYLATE 5 MG TA: 5 | 90 days supply | Qty: 90 | Fill #0

## 2016-10-12 NOTE — Telephone Encounter (Signed)
Rx sent. Left message on pt's voicemail that request has been completed and to call if any questions.

## 2016-10-12 NOTE — Telephone Encounter (Signed)
Relation to pt: self Call back [phone] Pharmacy: [facility] [place] - [address] [phone]) [phone] (Fax)    Reason for call:  Patient requesting a refill amLODipine (NORVASC) 5 MG tablet patient states she's completley out, patient schedule physical for 11/08/16

## 2016-10-13 ENCOUNTER — Other Ambulatory Visit: Payer: Self-pay | Admitting: Family

## 2016-10-13 NOTE — Telephone Encounter (Signed)
Medication Detail   Disp Refills Start End   amLODipine (NORVASC) 5 MG tablet 90 tablet 0 10/12/2016    Sig - Route: Take 1 tablet (5 mg total) by mouth daily. - Oral   E-Prescribing Status: Receipt confirmed by pharmacy (10/12/2016 12:26 PM EDT)   Pharmacy  [facility] [place],  - [address]

## 2016-10-13 NOTE — Telephone Encounter (Signed)
error:[id number] ° °

## 2016-11-08 ENCOUNTER — Encounter: Payer: Self-pay | Admitting: Family

## 2016-11-08 ENCOUNTER — Ambulatory Visit (INDEPENDENT_AMBULATORY_CARE_PROVIDER_SITE_OTHER): Payer: PRIVATE HEALTH INSURANCE | Admitting: Family

## 2016-11-08 VITALS — BP 175/86 | HR 59 | Temp 97.7°F | Resp 16 | Ht 63.0 in | Wt 156.2 lb

## 2016-11-08 DIAGNOSIS — I10 Essential (primary) hypertension: Secondary | ICD-10-CM

## 2016-11-08 DIAGNOSIS — R232 Flushing: Secondary | ICD-10-CM

## 2016-11-08 DIAGNOSIS — Z0000 Encounter for general adult medical examination without abnormal findings: Secondary | ICD-10-CM | POA: Diagnosis not present

## 2016-11-08 MED ORDER — VENLAFAXINE HCL ER 37.5 MG PO CP24: 37.5000 mg | ORAL_CAPSULE | Freq: Every day | ORAL | 2 refills | Status: DC

## 2016-11-08 MED ORDER — AMLODIPINE BESYLATE 10 MG PO TABS: 10.0000 mg | ORAL_TABLET | Freq: Every day | ORAL | 3 refills | Status: DC

## 2016-11-08 MED FILL — VENLAFAXINE HCL ER 37.5 MG: 37.5 | 30 days supply | Qty: 30 | Fill #0

## 2016-11-08 MED FILL — AMLODIPINE BESYLATE 10 MG T: 10 | 30 days supply | Qty: 30 | Fill #0

## 2016-11-08 NOTE — Patient Instructions (Addendum)
Increase your amlodipine from 5mg  to 10mg .  Once we get your blood pressure down, we will have you start effexor for your hot flashes-hold off on starting for now.

## 2016-11-08 NOTE — Progress Notes (Addendum)
Subjective:    Patient ID: Anna Werner, female    DOB: August 15, 1960, 56 y.o.   MRN: [id number]  HPI  [name] is a 56 yr old female who presents today for cpx.  Immunizations: Tdap 2014 Diet: reports diet is poor Exercise: not exercising Colonoscopy:  Due Pap Smear:  hysterectomy Mammogram: 5/18  Notes + fatigue, hot flashes. Day and night.  Feels miserable due to the hot flashes.   Wt Readings from Last 3 Encounters:  11/08/16 156 lb 3.2 oz (70.9 kg)  03/15/16 156 lb 9.6 oz (71 kg)  09/16/15 152 lb (68.9 kg)   BP Readings from Last 3 Encounters:  11/08/16 (!) 157/86  03/15/16 133/72  09/16/15 112/68   Constipation- did not like linzess- caused diarrhea, taking MOM instead which helps   Review of Systems  Constitutional: Negative for unexpected weight change.  HENT: Negative for hearing loss.   Eyes: Negative for visual disturbance.  Respiratory: Negative for cough.   Cardiovascular: Negative for leg swelling.  Gastrointestinal: Positive for constipation. Negative for diarrhea.       Uses MOM prn which helps  Genitourinary: Negative for dysuria and frequency.  Musculoskeletal: Negative for arthralgias and myalgias.  Neurological: Negative for headaches.  Hematological: Negative for adenopathy.  Psychiatric/Behavioral:       Denies depression, has had some anxiety recently.       Past Medical History:  Diagnosis Date  . Anxiety   . Arthritis   . Depression   . HSV-2 infection   . Hypertension   . Thyroid nodule    Dr Elvera LennoxGherghe     Social History   Social History  . Marital status: Divorced    Spouse name: N/A  . Number of children: N/A  . Years of education: N/A   Occupational History  . Not on file.   Social History Main Topics  . Smoking status: Former Smoker    Types: Cigarettes  . Smokeless tobacco: Never Used  . Alcohol use 4.2 oz/week    7 Glasses of wine per week  . Drug use: No  . Sexual activity: Yes   Other Topics Concern  . 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    Enjoys reading and spending time with grandson    Lives alone       Past Surgical History:  Procedure Laterality Date  . ABDOMINAL HYSTERECTOMY    . BIOPSY THYROID  05/10/13  . BREAST BIOPSY Left ?  Marland Kitchen. BREAST LUMPECTOMY Right 2012?  . HERNIA REPAIR      Family History  Problem Relation Age of Onset  . Family history unknown: Yes    No Known Allergies  Current Outpatient Prescriptions on File Prior to Visit  Medication Sig Dispense Refill  . ALPRAZolam (XANAX) 0.5 MG tablet TAKE 1 TABLET(S) BY MOUTH DAILY AS NEEDED FOR ANXIETY 30 tablet 0  . amLODipine (NORVASC) 5 MG tablet Take 1 tablet (5 mg total) by mouth daily. 90 tablet 0  . calcium carbonate 200 MG capsule Take 500 mg by mouth 2 (two) times daily with a meal.    . cetirizine (ZYRTEC) 10 MG tablet Take 10 mg by mouth as needed.     . cholecalciferol (VITAMIN D) 1000 UNITS tablet Take 1,000 Units by mouth daily.    .Marland Kitchen  fish oil-omega-3 fatty acids 1000 MG capsule Take 2 g by mouth daily.    . Lactobacillus (ACIDOPHILUS) TABS Take 1 tablet by mouth daily.    Marland Kitchen losartan-hydrochlorothiazide (HYZAAR) 100-25 MG tablet Take 1 tablet by mouth daily. 90 tablet 1  . mometasone (ELOCON) 0.1 % cream Apply topically daily. 45 g 0  . potassium chloride SA (K-DUR,KLOR-CON) 20 MEQ tablet Take 1 tablet (20 mEq total) by mouth daily. 90 tablet 1  . ranitidine (ZANTAC) 150 MG tablet Take 150 mg by mouth 2 (two) times daily as needed.     . vitamin C (ASCORBIC ACID) 500 MG tablet Take 500 mg by mouth daily.     No current facility-administered medications on file prior to visit.     BP (!) 157/86 (BP Location: Left Arm, Cuff Size: Normal)   Pulse (!) 59   Temp 97.7 F (36.5 C) (Oral)   Resp 16   Ht 5\' 3"  (1.6 m)   Wt 156 lb 3.2 oz (70.9 kg)   SpO2 100%   BMI 27.67  kg/m    Objective:   Physical Exam  Physical Exam  Constitutional: She is oriented to person, place, and time. She appears well-developed and well-nourished. No distress.  HENT:  Head: Normocephalic and atraumatic.  Right Ear: Tympanic membrane and ear canal normal.  Left Ear: Tympanic membrane and ear canal normal.  Mouth/Throat: Oropharynx is clear and moist.  Eyes: Pupils are equal, round, and reactive to light. No scleral icterus.  Neck: Normal range of motion. No thyromegaly present.  Cardiovascular: Normal rate and regular rhythm.   No murmur heard. Pulmonary/Chest: Effort normal and breath sounds normal. No respiratory distress. He has no wheezes. She has no rales. She exhibits no tenderness.  Abdominal: Soft. Bowel sounds are normal. She exhibits no distension and no mass. There is no tenderness. There is no rebound and no guarding.  Musculoskeletal: She exhibits no edema.  Lymphadenopathy:    She has no cervical adenopathy.  Neurological: She is alert and oriented to person, place, and time. She has normal patellar reflexes. She exhibits normal muscle tone. Coordination normal.  Skin: Skin is warm and dry.  Psychiatric: She has a normal mood and affect. Her behavior is normal. Judgment and thought content normal.  Breasts: Examined lying Right: Without masses, retractions, discharge or axillary adenopathy.  Left: Without masses, retractions, discharge or axillary adenopathy.  Pelvic: deferred     Assessment & Plan:  Preventative care- discussed healthy diet and regular exercise.  She reports that she is up to date on her colo and we will request colo records. She signed a records release today. EKG tracing is personally reviewed.  EKG notes NSR.  No acute changes.    HTN- uncontrolled, increase amlodipine from 5mg  to 10mg .  Hot flashes- uncontrolled. Would like to start effexor, but I would like to get her BP down a bit first. I will have her come back in 1 week for BP  check prior to having her start the effexor.           Assessment & Plan:

## 2016-11-09 LAB — HEPATIC FUNCTION PANEL
ALBUMIN: 4.3 g/dL (ref 3.5–5.2)
ALT: 11 U/L (ref 0–35)
AST: 17 U/L (ref 0–37)
Alkaline Phosphatase: 64 U/L (ref 39–117)
BILIRUBIN TOTAL: 0.6 mg/dL (ref 0.2–1.2)
Bilirubin, Direct: 0.1 mg/dL (ref 0.0–0.3)
Total Protein: 7.3 g/dL (ref 6.0–8.3)

## 2016-11-09 LAB — URINALYSIS, ROUTINE W REFLEX MICROSCOPIC
Bilirubin Urine: NEGATIVE
Hgb urine dipstick: NEGATIVE
KETONES UR: NEGATIVE
Leukocytes, UA: NEGATIVE
Nitrite: NEGATIVE
PH: 6.5 (ref 5.0–8.0)
RBC / HPF: NONE SEEN (ref 0–?)
Total Protein, Urine: NEGATIVE
URINE GLUCOSE: NEGATIVE
UROBILINOGEN UA: 0.2 (ref 0.0–1.0)

## 2016-11-09 LAB — CBC WITH DIFFERENTIAL/PLATELET
Basophils Absolute: 0.1 10*3/uL (ref 0.0–0.1)
Basophils Relative: 0.9 % (ref 0.0–3.0)
EOS PCT: 1.2 % (ref 0.0–5.0)
Eosinophils Absolute: 0.1 10*3/uL (ref 0.0–0.7)
HCT: 40 % (ref 36.0–46.0)
Hemoglobin: 13 g/dL (ref 12.0–15.0)
LYMPHS ABS: 2.8 10*3/uL (ref 0.7–4.0)
Lymphocytes Relative: 34.7 % (ref 12.0–46.0)
MCHC: 32.6 g/dL (ref 30.0–36.0)
MCV: 77 fl — ABNORMAL LOW (ref 78.0–100.0)
MONO ABS: 0.7 10*3/uL (ref 0.1–1.0)
Monocytes Relative: 8.3 % (ref 3.0–12.0)
NEUTROS ABS: 4.4 10*3/uL (ref 1.4–7.7)
Neutrophils Relative %: 54.9 % (ref 43.0–77.0)
PLATELETS: 244 10*3/uL (ref 150.0–400.0)
RBC: 5.19 Mil/uL — AB (ref 3.87–5.11)
RDW: 15.4 % (ref 11.5–15.5)
WBC: 8.1 10*3/uL (ref 4.0–10.5)

## 2016-11-09 LAB — LIPID PANEL
CHOLESTEROL: 216 mg/dL — AB (ref 0–200)
HDL: 82.2 mg/dL (ref >39.00)
LDL CALC: 117 mg/dL — AB (ref 0–99)
NonHDL: 134.02
TRIGLYCERIDES: 84 mg/dL (ref 0.0–149.0)
Total CHOL/HDL Ratio: 3
VLDL: 16.8 mg/dL (ref 0.0–40.0)

## 2016-11-09 LAB — BASIC METABOLIC PANEL
BUN: 12 mg/dL (ref 6–23)
CO2: 34 meq/L — AB (ref 19–32)
Calcium: 9.9 mg/dL (ref 8.4–10.5)
Chloride: 98 mEq/L (ref 96–112)
Creatinine, Ser: 0.67 mg/dL (ref 0.40–1.20)
GFR: 97.61 mL/min (ref >60.00)
GLUCOSE: 93 mg/dL (ref 70–99)
POTASSIUM: 3.4 meq/L — AB (ref 3.5–5.1)
Sodium: 140 mEq/L (ref 135–145)

## 2016-11-09 LAB — TSH: TSH: 0.56 u[IU]/mL (ref 0.35–4.50)

## 2016-11-11 ENCOUNTER — Other Ambulatory Visit: Payer: Self-pay | Admitting: Family

## 2016-11-11 DIAGNOSIS — E876 Hypokalemia: Secondary | ICD-10-CM

## 2016-11-15 ENCOUNTER — Encounter: Payer: Self-pay | Admitting: Family

## 2016-11-15 ENCOUNTER — Ambulatory Visit (INDEPENDENT_AMBULATORY_CARE_PROVIDER_SITE_OTHER): Payer: PRIVATE HEALTH INSURANCE | Admitting: Family

## 2016-11-15 VITALS — BP 138/74 | HR 72 | Temp 98.3°F | Resp 16 | Ht 63.0 in | Wt 155.8 lb

## 2016-11-15 DIAGNOSIS — R232 Flushing: Secondary | ICD-10-CM

## 2016-11-15 DIAGNOSIS — I10 Essential (primary) hypertension: Secondary | ICD-10-CM

## 2016-11-15 NOTE — Progress Notes (Signed)
Subjective:    Patient ID: Anna Werner, female    DOB: 04-02-1961, 56 y.o.   MRN: [id number]  HPI  Hypertension- last visit blood pressure was noted to be elevated. We increased her amlodipine from 5-10 mg by mouth once daily. She reports tolerating this without difficulty. She denies lower extremity edema. BP Readings from Last 3 Encounters:  11/15/16 138/74  11/08/16 (!) 175/86  03/15/16 133/72   Hot flashes-she continues to have frequent hot flashes. She states that she has given it some more thought and would like to hold off on starting any medication for her hot flashes.   Review of Systems    see HPI  Past Medical History:  Diagnosis Date  . Anxiety   . Arthritis   . Depression   . HSV-2 infection   . Hypertension   . Thyroid nodule    Dr Elvera Lennox     Social History   Social History  . Marital status: Divorced    Spouse name: N/A  . Number of children: N/A  . Years of education: N/A   Occupational History  . Not on file.   Social History Main Topics  . Smoking status: Former Smoker    Types: Cigarettes  . Smokeless tobacco: Never Used  . Alcohol use 4.2 oz/week    7 Glasses of wine per week     Comment: 1 per night  . Drug use: No  . Sexual activity: Yes   Other Topics Concern  . Not on file   Social History Narrative   Regular exercise: no   Caffeine use: coffee daily   Daughter in [place]- has son   Son in [place]   Works as an Public house manager at [employer].    Enjoys reading and spending time with grandson    Lives alone          Past Surgical History:  Procedure Laterality Date  . ABDOMINAL HYSTERECTOMY    . BIOPSY THYROID  05/10/13  . BREAST BIOPSY Left ?  Marland Kitchen BREAST LUMPECTOMY Right 2012?  . HERNIA REPAIR      Family History  Problem Relation Age of Onset  . Alcohol abuse Mother     No Known Allergies  Current Outpatient Prescriptions on File Prior to Visit  Medication Sig Dispense Refill  . ALPRAZolam (XANAX) 0.5 MG tablet TAKE 1  TABLET(S) BY MOUTH DAILY AS NEEDED FOR ANXIETY 30 tablet 0  . amLODipine (NORVASC) 10 MG tablet Take 1 tablet (10 mg total) by mouth daily. 30 tablet 3  . calcium carbonate 200 MG capsule Take 500 mg by mouth 2 (two) times daily with a meal.    . cetirizine (ZYRTEC) 10 MG tablet Take 10 mg by mouth as needed.     . cholecalciferol (VITAMIN D) 1000 UNITS tablet Take 1,000 Units by mouth daily.    . fish oil-omega-3 fatty acids 1000 MG capsule Take 2 g by mouth daily.    . Lactobacillus (ACIDOPHILUS) TABS Take 1 tablet by mouth daily.    Marland Kitchen losartan-hydrochlorothiazide (HYZAAR) 100-25 MG tablet Take 1 tablet by mouth daily. 90 tablet 1  . mometasone (ELOCON) 0.1 % cream Apply topically daily. 45 g 0  . potassium chloride SA (K-DUR,KLOR-CON) 20 MEQ tablet Take 1 tablet (20 mEq total) by mouth daily. 90 tablet 1  . ranitidine (ZANTAC) 150 MG tablet Take 150 mg by mouth 2 (two) times daily as needed.     . venlafaxine XR (EFFEXOR XR) 37.5 MG 24 hr  capsule Take 1 capsule (37.5 mg total) by mouth daily with breakfast. 30 capsule 2  . vitamin C (ASCORBIC ACID) 500 MG tablet Take 500 mg by mouth daily.     No current facility-administered medications on file prior to visit.     BP 138/74 (BP Location: Right Arm, Cuff Size: Normal)   Pulse 72   Temp 98.3 F (36.8 C) (Oral)   Resp 16   Ht 5\' 3"  (1.6 m)   Wt 155 lb 12.8 oz (70.7 kg)   SpO2 100%   BMI 27.60 kg/m    Objective:   Physical Exam  Constitutional: She is oriented to person, place, and time. She appears well-developed and well-nourished.  Cardiovascular: Normal rate, regular rhythm and normal heart sounds.   No murmur heard. Pulmonary/Chest: Effort normal and breath sounds normal. No respiratory distress. She has no wheezes.  Neurological: She is alert and oriented to person, place, and time.  Psychiatric: She has a normal mood and affect. Her behavior is normal. Judgment and thought content normal.          Assessment & Plan:    HTN- improved.  Continue increased dose of amlodipine along with hyzaar.  Hot flashes- unchanged- she wishes to hold off on start effexor. Advised pt to let me know if she changes her mind.

## 2016-11-18 ENCOUNTER — Other Ambulatory Visit: Payer: PRIVATE HEALTH INSURANCE

## 2016-11-19 ENCOUNTER — Telehealth: Payer: Self-pay | Admitting: *Deleted

## 2016-11-19 DIAGNOSIS — F419 Anxiety disorder, unspecified: Secondary | ICD-10-CM

## 2016-11-19 MED ORDER — ALPRAZOLAM 0.5 MG PO TABS: ORAL_TABLET | ORAL | 0 refills | Status: DC

## 2016-11-19 MED FILL — ALPRAZolam 0.5 MG TABS: 0.5 | 30 days supply | Qty: 30 | Fill #0

## 2016-11-19 NOTE — Telephone Encounter (Signed)
Rx request phoned to pharmacy [[facility]]/SLS 07/13

## 2016-11-19 NOTE — Telephone Encounter (Signed)
[facility] [place] [facility] Alprazolam 0.5 mg tab Last filled by MD on 09/21/16, #30x0 Last AEX - 11/15/16 Last UDS -07/22/16, moderate, per provider Rx PRN Please Advise on refills/SLS 07/13

## 2016-11-19 NOTE — Telephone Encounter (Signed)
OK to refill Alprazolam with same sig, same number. No further refills

## 2016-12-21 MED FILL — LOSARTAN-HCTZ 100-25 MG TAB: 100-25 | 90 days supply | Qty: 90 | Fill #1

## 2017-01-05 ENCOUNTER — Telehealth: Payer: Self-pay | Admitting: Family

## 2017-01-05 NOTE — Telephone Encounter (Signed)
Pt would like to have her A1C checked also.    Please place orders.    Once orders are placed I will call to schedule pt.

## 2017-01-05 NOTE — Telephone Encounter (Signed)
Spoke with pt. She states she "just hasn't been feeling right lateley". Has had some tingling in fingers and toes, "gets in a slump and feels better if she eats". She ate breakfast between 10:30 and 11am today and started feeling sluggish so she checked her blood sugar between 11:30am and 12pm and was 174. 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  Pt would like to have hgba1c done.  Please advise?

## 2017-01-05 NOTE — Telephone Encounter (Signed)
Let's bring her in for an office visit please.

## 2017-01-06 NOTE — Telephone Encounter (Signed)
Please call patient and schedule

## 2017-01-06 NOTE — Telephone Encounter (Signed)
lvm for pt to call back to schedule apt.  °

## 2017-01-11 ENCOUNTER — Encounter (HOSPITAL_BASED_OUTPATIENT_CLINIC_OR_DEPARTMENT_OTHER): Payer: Self-pay | Admitting: Emergency Medicine

## 2017-01-11 ENCOUNTER — Emergency Department (HOSPITAL_BASED_OUTPATIENT_CLINIC_OR_DEPARTMENT_OTHER): Payer: PRIVATE HEALTH INSURANCE

## 2017-01-11 ENCOUNTER — Emergency Department (HOSPITAL_BASED_OUTPATIENT_CLINIC_OR_DEPARTMENT_OTHER)
Admission: EM | Admit: 2017-01-11 | Discharge: 2017-01-11 | Disposition: A | Discharge status: Home / Self Care | Payer: PRIVATE HEALTH INSURANCE | Attending: Emergency Medicine | Admitting: Emergency Medicine

## 2017-01-11 DIAGNOSIS — Z87891 Personal history of nicotine dependence: Secondary | ICD-10-CM | POA: Insufficient documentation

## 2017-01-11 DIAGNOSIS — Z79899 Other long term (current) drug therapy: Secondary | ICD-10-CM | POA: Insufficient documentation

## 2017-01-11 DIAGNOSIS — I10 Essential (primary) hypertension: Secondary | ICD-10-CM | POA: Insufficient documentation

## 2017-01-11 DIAGNOSIS — I493 Ventricular premature depolarization: Secondary | ICD-10-CM | POA: Insufficient documentation

## 2017-01-11 DIAGNOSIS — R002 Palpitations: Secondary | ICD-10-CM

## 2017-01-11 DIAGNOSIS — H538 Other visual disturbances: Secondary | ICD-10-CM | POA: Insufficient documentation

## 2017-01-11 DIAGNOSIS — R200 Anesthesia of skin: Secondary | ICD-10-CM | POA: Diagnosis present

## 2017-01-11 DIAGNOSIS — R202 Paresthesia of skin: Secondary | ICD-10-CM

## 2017-01-11 DIAGNOSIS — H539 Unspecified visual disturbance: Secondary | ICD-10-CM

## 2017-01-11 LAB — COMPREHENSIVE METABOLIC PANEL
ALK PHOS: 87 U/L (ref 38–126)
ALT: 21 U/L (ref 14–54)
ANION GAP: 13 (ref 5–15)
AST: 29 U/L (ref 15–41)
Albumin: 4.6 g/dL (ref 3.5–5.0)
BUN: 17 mg/dL (ref 6–20)
CALCIUM: 9.1 mg/dL (ref 8.9–10.3)
CO2: 22 mmol/L (ref 22–32)
Chloride: 100 mmol/L — ABNORMAL LOW (ref 101–111)
Creatinine, Ser: 0.62 mg/dL (ref 0.44–1.00)
Glucose, Bld: 92 mg/dL (ref 65–99)
Potassium: 3.7 mmol/L (ref 3.5–5.1)
SODIUM: 135 mmol/L (ref 135–145)
TOTAL PROTEIN: 7.7 g/dL (ref 6.5–8.1)
Total Bilirubin: 0.6 mg/dL (ref 0.3–1.2)

## 2017-01-11 LAB — CBC WITH DIFFERENTIAL/PLATELET
Basophils Absolute: 0 10*3/uL (ref 0.0–0.1)
Basophils Relative: 0 %
EOS ABS: 0.1 10*3/uL (ref 0.0–0.7)
EOS PCT: 1 %
HCT: 39.6 % (ref 36.0–46.0)
HEMOGLOBIN: 13.1 g/dL (ref 12.0–15.0)
LYMPHS ABS: 2.7 10*3/uL (ref 0.7–4.0)
Lymphocytes Relative: 32 %
MCH: 24.3 pg — AB (ref 26.0–34.0)
MCHC: 33.1 g/dL (ref 30.0–36.0)
MCV: 73.5 fL — AB (ref 78.0–100.0)
MONOS PCT: 9 %
Monocytes Absolute: 0.8 10*3/uL (ref 0.1–1.0)
NEUTROS PCT: 58 %
Neutro Abs: 4.8 10*3/uL (ref 1.7–7.7)
Platelets: 240 10*3/uL (ref 150–400)
RBC: 5.39 MIL/uL — ABNORMAL HIGH (ref 3.87–5.11)
RDW: 15.2 % (ref 11.5–15.5)
WBC: 8.4 10*3/uL (ref 4.0–10.5)

## 2017-01-11 LAB — I-STAT VENOUS BLOOD GAS, ED
BICARBONATE: 25 mmol/L (ref 20.0–28.0)
O2 SAT: 71 %
PCO2 VEN: 43.2 mmHg — AB (ref 44.0–60.0)
PO2 VEN: 39 mmHg (ref 32.0–45.0)
TCO2: 26 mmol/L (ref 22–32)
pH, Ven: 7.371 (ref 7.250–7.430)

## 2017-01-11 LAB — TSH: TSH: 1.18 u[IU]/mL (ref 0.350–4.500)

## 2017-01-11 LAB — URINALYSIS, ROUTINE W REFLEX MICROSCOPIC
BILIRUBIN URINE: NEGATIVE
GLUCOSE, UA: NEGATIVE mg/dL
HGB URINE DIPSTICK: NEGATIVE
Ketones, ur: 15 mg/dL — AB
Leukocytes, UA: NEGATIVE
Nitrite: NEGATIVE
PH: 6 (ref 5.0–8.0)
Protein, ur: NEGATIVE mg/dL

## 2017-01-11 LAB — PROTIME-INR
INR: 1
PROTHROMBIN TIME: 13.1 s (ref 11.4–15.2)

## 2017-01-11 LAB — TROPONIN I

## 2017-01-11 LAB — CK: Total CK: 123 U/L (ref 38–234)

## 2017-01-11 LAB — MAGNESIUM: MAGNESIUM: 2 mg/dL (ref 1.7–2.4)

## 2017-01-11 MED ORDER — SODIUM CHLORIDE 0.9 % IV BOLUS (SEPSIS)
1000.0000 mL | Freq: Once | INTRAVENOUS | Status: AC
  Administered 2017-01-11: 1000 mL via INTRAVENOUS

## 2017-01-11 NOTE — Telephone Encounter (Signed)
Mychart message sent.  Encounter closed until patient either returns call or responds to [employer] message.

## 2017-01-11 NOTE — ED Triage Notes (Signed)
Pt came from work.  Pt started having right eye flashes and pain.  Continued to sob, chest pain, diaphoresis and left sided numbness.  Pt states she feels like she is going to pass out.

## 2017-01-11 NOTE — Discharge Instructions (Signed)
ER workup today did not show evidence of stroke. I suspect you were having dehydration in the setting of your new diet. Please try and go back to a more normal diet and fluid intake until cleared by her primary care physician. Please follow-up with your PCP in the next several days for reassessment and repeat EKG. If any symptoms change or worsen, please return to the nearest emergency department.

## 2017-01-11 NOTE — ED Provider Notes (Signed)
[facility]   CSN: [id number] Arrival date & time: 01/11/17  1522     History   Chief Complaint Chief Complaint  Patient presents with  . Numbness  . Eye Pain    flashes    HPI Anna Werner is a 56 y.o. female.   The history is provided by the patient.  Palpitations   This is a recurrent problem. The current episode started less than 1 hour ago. The problem occurs constantly. The problem has not changed since onset.The problem is associated with an unknown factor. On average, each episode lasts 1 hour. Associated symptoms include diaphoresis and irregular heartbeat. Pertinent negatives include no fever, no malaise/fatigue, no numbness, no chest pain, no chest pressure, no exertional chest pressure, no nausea, no vomiting, no headaches, no back pain, no leg pain, no lower extremity edema, no weakness, no cough, no shortness of breath and no sputum production. She has tried nothing for the symptoms. Risk factors include smoking/tobacco exposure. Her past medical history does not include heart disease.    Past Medical History:  Diagnosis Date  . Anxiety   . Arthritis   . Depression   . HSV-2 infection   . Hypertension   . Thyroid nodule    Dr Elvera Lennox    Patient Active Problem List   Diagnosis Date Noted  . Insomnia 03/15/2016  . GERD (gastroesophageal reflux disease) 03/15/2016  . Preventative health care 08/25/2015  . IBS (irritable bowel syndrome) 08/25/2015  . Menopausal syndrome (hot flushes) 06/19/2013  . Breast density 09/07/2012  . Arthralgia 03/15/2012  . Hx of colonic polyps 01/30/2012  . S/P hysterectomy 01/30/2012  . History of anemia 01/30/2012  . Breast calcifications on mammogram 01/30/2012  . Multiple thyroid nodules 01/30/2012  . Anxiety 01/13/2012  . Depression 01/13/2012  . EtOH dependence (HCC) 01/13/2012  . Essential hypertension, benign 01/13/2012  . HSV-2 infection     Past Surgical History:  Procedure Laterality  Date  . ABDOMINAL HYSTERECTOMY    . BIOPSY THYROID  05/10/13  . BREAST BIOPSY Left ?  Marland Kitchen BREAST LUMPECTOMY Right 2012?  . HERNIA REPAIR      OB History    Gravida Para Term Preterm AB Living   2 2       2    SAB TAB Ectopic Multiple Live Births                   Home Medications    Prior to Admission medications   Medication Sig Start Date End Date Taking? Authorizing Provider  ALPRAZolam Prudy Feeler) 0.5 MG tablet TAKE 1 TABLET(S) BY MOUTH DAILY AS NEEDED FOR ANXIETY 11/19/16   Bradd Canary, MD  amLODipine (NORVASC) 10 MG tablet Take 1 tablet (10 mg total) by mouth daily. 11/08/16   Sandford Craze, NP  calcium carbonate 200 MG capsule Take 500 mg by mouth 2 (two) times daily with a meal.    [provider]  cetirizine (ZYRTEC) 10 MG tablet Take 10 mg by mouth as needed.     [provider]  cholecalciferol (VITAMIN D) 1000 UNITS tablet Take 1,000 Units by mouth daily.    [provider]  losartan-hydrochlorothiazide (HYZAAR) 100-25 MG tablet Take 1 tablet by mouth daily. 09/29/16   Sandford Craze, NP  potassium chloride SA (K-DUR,KLOR-CON) 20 MEQ tablet Take 1 tablet (20 mEq total) by mouth daily. 03/15/16   Sandford Craze, NP    Family History Family History  Problem Relation Age of Onset  .  Alcohol abuse Mother     Social History Social History  Substance Use Topics  . Smoking status: Former Smoker    Types: Cigarettes  . Smokeless tobacco: Never Used  . Alcohol use 4.2 oz/week    7 Glasses of wine per week     Comment: 1 per night     Allergies   Patient has no known allergies.   Review of Systems Review of Systems  Constitutional: Positive for diaphoresis. Negative for chills, fever and malaise/fatigue.  HENT: Negative for congestion.   Eyes: Positive for visual disturbance.  Respiratory: Negative for cough, sputum production, chest tightness, shortness of breath, wheezing and stridor.   Cardiovascular: Positive for  palpitations. Negative for chest pain and leg swelling.  Gastrointestinal: Negative for diarrhea, nausea and vomiting.  Genitourinary: Negative for dysuria, flank pain and frequency.  Musculoskeletal: Negative for back pain, neck pain and neck stiffness.  Skin: Negative for wound.  Neurological: Positive for light-headedness. Negative for weakness, numbness and headaches.       Tingling   Psychiatric/Behavioral: Negative for agitation.  All other systems reviewed and are negative.    Physical Exam Updated Vital Signs BP (!) 143/94   Pulse (!) 102   Temp 98.9 F (37.2 C)   Resp 16   Ht 5\' 3"  (1.6 m)   Wt 67.6 kg (149 lb)   SpO2 99%   BMI 26.39 kg/m   Physical Exam  Constitutional: She is oriented to person, place, and time. She appears well-developed and well-nourished. No distress.  HENT:  Head: Normocephalic.  Mouth/Throat: Oropharynx is clear and moist. No oropharyngeal exudate.  Eyes: Pupils are equal, round, and reactive to light. Conjunctivae and EOM are normal.  Neck: Normal range of motion.  Cardiovascular: Intact distal pulses.   Extrasystoles are present. Tachycardia present.   No murmur heard. Pulmonary/Chest: Effort normal. No stridor. No respiratory distress. She has no wheezes. She exhibits no tenderness.  Abdominal: Soft. Bowel sounds are normal. There is no tenderness.  Musculoskeletal: She exhibits no edema or tenderness.  Neurological: She is alert and oriented to person, place, and time. She is not disoriented. She displays tremor. No cranial nerve deficit or sensory deficit. She exhibits normal muscle tone. Coordination normal. GCS eye subscore is 4. GCS verbal subscore is 5. GCS motor subscore is 6.  Subjective tingling in both upper extremities, left leg  Skin: Capillary refill takes less than 2 seconds. No rash noted. She is not diaphoretic. No erythema.  Psychiatric: She has a normal mood and affect.  Nursing note and vitals reviewed.    ED  Treatments / Results  Labs (all labs ordered are listed, but only abnormal results are displayed) Labs Reviewed  CBC WITH DIFFERENTIAL/PLATELET - Abnormal; Notable for the following:       Result Value   RBC 5.39 (*)    MCV 73.5 (*)    MCH 24.3 (*)    All other components within normal limits  COMPREHENSIVE METABOLIC PANEL - Abnormal; Notable for the following:    Chloride 100 (*)    All other components within normal limits  URINALYSIS, ROUTINE W REFLEX MICROSCOPIC - Abnormal; Notable for the following:    Specific Gravity, Urine <1.005 (*)    Ketones, ur 15 (*)    All other components within normal limits  I-STAT VENOUS BLOOD GAS, ED - Abnormal; Notable for the following:    pCO2, Ven 43.2 (*)    All other components within normal limits  MAGNESIUM  TROPONIN I  PROTIME-INR  CK  TSH    EKG  EKG Interpretation  Date/Time:  Tuesday January 11 2017 15:37:45 EDT Ventricular Rate:  103 PR Interval:    QRS Duration: 81 QT Interval:  362 QTC Calculation: 474 R Axis:   79 Text Interpretation:  Sinus tachycardia Multiple premature complexes, vent & supraven Minimal ST depression, inferior leads Baseline wander in lead(s) V4 PVC present.  No prior ECG for comparison.  No STEMI Confirmed by Theda Belfast ([id number]) on 01/11/2017 5:19:38 PM       Radiology Ct Head Wo Contrast  Result Date: 01/11/2017 CLINICAL DATA:  Visual disturbance on the right. Pain. Left-sided numbness. Near syncope. EXAM: CT HEAD WITHOUT CONTRAST TECHNIQUE: Contiguous axial images were obtained from the base of the skull through the vertex without intravenous contrast. COMPARISON:  None. FINDINGS: Brain: Normal appearance without evidence of old or acute infarction, mass lesion, hemorrhage, hydrocephalus or extra-axial collection. Vascular: No abnormal vascular finding. Skull: No calvarial lesion. Small soft tissue prominence of the left parietal scalp which is nonspecific. This could be a hemangioma or other  soft tissue tumor, likely benign. The underlying calvarium appears normal. Sinuses/Orbits: Normal sinuses. Mastoids are clear. No orbital lesion visible. Other: None IMPRESSION: No cause of the clinical presentation is identified. Normal intracranial examination. Soft tissue prominence, possibly subgaleal, in the left parietal scalp likely to represent a benign entity, nonspecific. Electronically Signed   By: Paulina Fusi M.D.   On: 01/11/2017 16:34    Procedures Procedures (including critical care time)  Medications Ordered in ED Medications  sodium chloride 0.9 % bolus 1,000 mL (0 mLs Intravenous Stopped 01/11/17 1720)     Initial Impression / Assessment and Plan / ED Course  I have reviewed the triage vital signs and the nursing notes.  Pertinent labs & imaging results that were available during my care of the patient were reviewed by me and considered in my medical decision making (see chart for details).     Anna Werner is a 56 y.o. female with a past medical history significant for anxiety, hypertension, and prior thyroid nodule who presents with palpitations, lightheadedness, flashes in her right eye, and tingling in her bilateral upper extremity some left lower extremity. Patient reports that she started a new diet last week, a keto diet, and was feeling normal until approximately one hour prior to arrival. She says at 3 PM, she was at work and noticed flashes in the corner of her right eye vision. She says that they lasted for several minutes before resolving. She then developed tingling in her bilateral upper extremity is in her left leg. She says that she has never had this before. She does report that she also had onset of intense palpitations. She reports a long history of intermittent palpitations but has never had sustained palpitations like she is feeling today. She denied chest pain or shortness of breath at this time. She reports that she was feeling very lightheaded and near  syncopal but did not pass out. She denies any recent urinary changes or GI symptoms. She reports taking a new vitamin that she does not know what is called. She started this in the setting of her diet.   On initial exam, patient was seen to have intermittent bigeminy and trigeminy on telemetry. Initial EKG showed a sinus rhythm with numerous PVCs.  On initial exam, patient had no numbness in all extremities and face. No facial droop. Normal strength and all x-rays. Normal finger-nose-finger. Patient  had normal extraocular movements and normal pupil exam. Patient denied diplopia or blurred vision. She has not had any further flashes. She denies any headache or neck pain. No neck stiffness. No recent traumas. Patient's lungs were clear. Patient's abdomen was nontender. No significant lower extremity edema or tenderness.  Suspect electrolyte abnormalities in the setting of her new diet that may be contributing to palpitations. As patient's symptoms are only tingling at this time, do not feel patient is a candidate for a code stroke. Patient has no numbness or weakness on exam at this time.  Patient we given fluids and have laboratory testing and imaging collected initially. Anticipate speaking with neurology after workup initiated.  Patient had resolution of symptoms after fluids. No further tachycardia and all tingling resolved. No further episodes of flashes in her vision.  Diagnostic workup was grossly reassuring. CT imaging unremarkable. Neurology was called given the symptoms intermittent nature and neurology did not feel this was a TIA or stroke. They suspect complicated migraine versus symptoms in the setting of her dehydration with her new diet.   As symptoms have resolved and patient feels well and patient has no further PVCs seen after fluids, suspect dehydration and her diet as the cause of symptoms. Patient was observed for a period time with no further return of symptoms and no PVCs. Feel  patient is safe for discharge home with outpatient follow-up and reassessment.  Patient will scale back her diet and stay hydrated. Patient will follow with PCP. Strict return precautions were given and understood. Patient had no other questions or concerns and was discharged in good condition.   Final Clinical Impressions(s) / ED Diagnoses   Final diagnoses:  PVC's (premature ventricular contractions)  Palpitations  Paresthesia  Vision disturbance    New Prescriptions Discharge Medication List as of 01/11/2017  7:40 PM      Clinical Impression: 1. PVC's (premature ventricular contractions)   2. Palpitations   3. Paresthesia   4. Vision disturbance     Disposition: Discharge  Condition: Good  I have discussed the results, Dx and Tx plan with the pt(& family if present). He/she/they expressed understanding and agree(s) with the plan. Discharge instructions discussed at great length. Strict return precautions discussed and pt &/or family have verbalized understanding of the instructions. No further questions at time of discharge.    Discharge Medication List as of 01/11/2017  7:40 PM      Follow Up: Sandford Craze, NP [address] [place] [address] [phone]  Schedule an appointment as soon as possible for a visit    [facility] [place] [facility] [address] [id number] mc [address] [place] [address] [phone]  If symptoms worsen     Totiana Everson, Canary Brim, MD 01/12/17 0100

## 2017-01-13 MED FILL — AMLODIPINE BESYLATE 10 MG T: 10 | 30 days supply | Qty: 30 | Fill #1

## 2017-01-13 NOTE — Telephone Encounter (Signed)
lvm advising patient to call back and schedule ED follow up with PCP

## 2017-01-13 NOTE — Telephone Encounter (Signed)
Patient scheduled for Mon 01/17/2017 with NP

## 2017-01-13 NOTE — Telephone Encounter (Signed)
Pt was seen in the ER on 01/11/17.  Pt needs an ER follow up.   Please call and schedule.

## 2017-01-17 ENCOUNTER — Ambulatory Visit (INDEPENDENT_AMBULATORY_CARE_PROVIDER_SITE_OTHER): Payer: PRIVATE HEALTH INSURANCE | Admitting: Family

## 2017-01-17 ENCOUNTER — Encounter: Payer: Self-pay | Admitting: Family

## 2017-01-17 VITALS — BP 120/68 | HR 69 | Temp 98.0°F | Resp 16 | Ht 63.0 in | Wt 151.8 lb

## 2017-01-17 DIAGNOSIS — I10 Essential (primary) hypertension: Secondary | ICD-10-CM | POA: Diagnosis not present

## 2017-01-17 DIAGNOSIS — R002 Palpitations: Secondary | ICD-10-CM

## 2017-01-17 DIAGNOSIS — R202 Paresthesia of skin: Secondary | ICD-10-CM | POA: Diagnosis not present

## 2017-01-17 NOTE — Progress Notes (Signed)
Subjective:    Patient ID: Anna Werner, female    DOB: Sep 10, 1960, 56 y.o.   MRN: [id number]  HPI  Anna Werner is a 56 yr old female who presents today for ER follow up. ER record is reviewed from 01/11/17. Pt presented with c/o palpitations, paresthesias.  She had a normal CT scan. EKG noted sinus tachycardia with multiple premature complexes.  She denies stress or dehydration. TSH, electrolytes, troponin, cbc and CK levels were all OK.  Reports that she has been working on a low carb diet and is feeling better on this diet. She has lost 4 pounds since the summertime.   She reports feeling "fine now."  Denies numbness/palpitions or paresthesias currently.   Wt Readings from Last 3 Encounters:  01/17/17 151 lb 12.8 oz (68.9 kg)  01/11/17 149 lb (67.6 kg)  11/15/16 155 lb 12.8 oz (70.7 kg)   HTN- maintained on amlodipine and hyzaar.  BP Readings from Last 3 Encounters:  01/17/17 120/68  01/11/17 117/74  11/15/16 138/74    Review of Systems See HPI  Past Medical History:  Diagnosis Date  . Anxiety   . Arthritis   . Depression   . HSV-2 infection   . Hypertension   . Thyroid nodule    Dr Elvera Lennox     Social History   Social History  . Marital status: Divorced    Spouse name: N/A  . Number of children: N/A  . Years of education: N/A   Occupational History  . Not on file.   Social History Main Topics  . Smoking status: Former Smoker    Types: Cigarettes  . Smokeless tobacco: Never Used  . Alcohol use 4.2 oz/week    7 Glasses of wine per week     Comment: 1 per night  . Drug use: No  . Sexual activity: Yes   Other Topics Concern  . Not on file   Social History Narrative   Regular exercise: no   Caffeine use: coffee daily   Daughter in [place]- has son   Son in [place]   Works as an Public house manager at [employer].    Enjoys reading and spending time with grandson    Lives alone          Past Surgical History:  Procedure Laterality Date  . ABDOMINAL HYSTERECTOMY     . BIOPSY THYROID  05/10/13  . BREAST BIOPSY Left ?  Marland Kitchen BREAST LUMPECTOMY Right 2012?  . HERNIA REPAIR      Family History  Problem Relation Age of Onset  . Alcohol abuse Mother     No Known Allergies  Current Outpatient Prescriptions on File Prior to Visit  Medication Sig Dispense Refill  . ALPRAZolam (XANAX) 0.5 MG tablet TAKE 1 TABLET(S) BY MOUTH DAILY AS NEEDED FOR ANXIETY 30 tablet 0  . amLODipine (NORVASC) 10 MG tablet Take 1 tablet (10 mg total) by mouth daily. 30 tablet 3  . calcium carbonate 200 MG capsule Take 500 mg by mouth 2 (two) times daily with a meal.    . cetirizine (ZYRTEC) 10 MG tablet Take 10 mg by mouth as needed.     . cholecalciferol (VITAMIN D) 1000 UNITS tablet Take 1,000 Units by mouth daily.    Marland Kitchen losartan-hydrochlorothiazide (HYZAAR) 100-25 MG tablet Take 1 tablet by mouth daily. 90 tablet 1  . potassium chloride SA (K-DUR,KLOR-CON) 20 MEQ tablet Take 1 tablet (20 mEq total) by mouth daily. 90 tablet 1   No current facility-administered medications  on file prior to visit.     BP 120/68 (BP Location: Left Arm, Cuff Size: Normal)   Pulse 69   Temp 98 F (36.7 C) (Oral)   Resp 16   Ht  (1.6 m)   Wt 151 lb 12.8 oz (68.9 kg)   SpO2 100%   BMI 26.89 kg/m       Objective:   Physical Exam  Constitutional: She is oriented to person, place, and time. She appears well-developed and well-nourished.  Cardiovascular: Normal rate, regular rhythm and normal heart sounds.   No murmur heard. Pulmonary/Chest: Effort normal and breath sounds normal. No respiratory distress. She has no wheezes.  Musculoskeletal: She exhibits no edema.  Lymphadenopathy:    She has no cervical adenopathy.  Neurological: She is alert and oriented to person, place, and time.  Psychiatric: She has a normal mood and affect. Her behavior is normal. Judgment and thought content normal.          Assessment & Plan:  Palpitations- resolved. Lab work looked good. HR normal  today. Discussed referral to cardiology for further evaluation but she declines. Discussed well balanced diet.   HTN- BP stable on current meds. Continue same.   Paresthesias- resolved.  Monitor.

## 2017-01-17 NOTE — Patient Instructions (Signed)
Please complete lab work prior to leaving.   

## 2017-02-01 ENCOUNTER — Telehealth: Payer: Self-pay | Admitting: *Deleted

## 2017-02-01 DIAGNOSIS — F419 Anxiety disorder, unspecified: Secondary | ICD-10-CM

## 2017-02-01 MED ORDER — ALPRAZOLAM 0.5 MG PO TABS: ORAL_TABLET | ORAL | 0 refills | Status: DC

## 2017-02-01 MED FILL — ALPRAZolam 0.5 MG TABS: 0.5 | 30 days supply | Qty: 30 | Fill #0

## 2017-02-01 NOTE — Telephone Encounter (Signed)
Rx faxed to pharmacy  

## 2017-02-01 NOTE — Telephone Encounter (Signed)
Received fax from [facility] requesting alprazolam 0.5mg .  Last Rx:  11/19/16, #30 Last OV: 01/17/17 Next OV: due 07/17/17 UDS:  11/08/16  Rx printed and forwarded to PCP for signature.

## 2017-03-09 ENCOUNTER — Encounter: Payer: Self-pay | Admitting: Family

## 2017-03-09 DIAGNOSIS — R002 Palpitations: Secondary | ICD-10-CM

## 2017-03-16 ENCOUNTER — Other Ambulatory Visit: Payer: Self-pay | Admitting: Family

## 2017-03-24 MED FILL — LOSARTAN-HCTZ 100-25 MG TAB: 100-25 | 90 days supply | Qty: 90 | Fill #0

## 2017-03-25 MED FILL — AMLODIPINE BESYLATE 10 MG T: 10 | 30 days supply | Qty: 30 | Fill #2

## 2017-04-07 ENCOUNTER — Encounter: Payer: Self-pay | Admitting: Family

## 2017-04-10 ENCOUNTER — Other Ambulatory Visit: Payer: Self-pay | Admitting: Family

## 2017-04-25 MED FILL — AMLODIPINE BESYLATE 10 MG T: 10 | 30 days supply | Qty: 30 | Fill #3

## 2017-06-01 ENCOUNTER — Other Ambulatory Visit: Payer: Self-pay | Admitting: Family

## 2017-06-01 DIAGNOSIS — F419 Anxiety disorder, unspecified: Secondary | ICD-10-CM

## 2017-06-02 MED ORDER — ALPRAZOLAM 0.5 MG PO TABS: ORAL_TABLET | ORAL | 0 refills | Status: DC

## 2017-06-02 MED ORDER — POTASSIUM CHLORIDE CRYS ER 20 MEQ PO TBCR: 20.0000 meq | EXTENDED_RELEASE_TABLET | Freq: Every day | ORAL | 5 refills | Status: DC

## 2017-06-02 MED FILL — ALPRAZolam 0.5 MG TABS: 0.5 | 30 days supply | Qty: 30 | Fill #0

## 2017-06-02 MED FILL — POTASSIUM CL ER 20 MEQ TABL: 20 | 30 days supply | Qty: 30 | Fill #0

## 2017-06-02 NOTE — Telephone Encounter (Signed)
LV: 01/17/17 NW:GNFAV:None LR:02/01/17 0 refils UDS:11/08/16 CSC:06/30/15

## 2017-06-03 ENCOUNTER — Other Ambulatory Visit: Payer: Self-pay | Admitting: Family

## 2017-06-03 NOTE — Telephone Encounter (Signed)
Amlodipine refill sent to pharmacy. Pt last seen 01/17/17 and due for follow up 07/17/17. Mychart message sent to pt.

## 2017-06-10 MED FILL — AMLODIPINE BESYLATE 10 MG T: 10 | 30 days supply | Qty: 30 | Fill #0

## 2017-06-27 MED FILL — LOSARTAN-HCTZ 100-25 MG TAB: 100-25 | 90 days supply | Qty: 90 | Fill #1

## 2017-07-13 MED FILL — AMLODIPINE BESYLATE 10 MG T: 10 | 30 days supply | Qty: 30 | Fill #1

## 2017-07-13 MED FILL — POTASSIUM CL ER 20 MEQ TABL: 20 | 30 days supply | Qty: 30 | Fill #1

## 2017-08-07 ENCOUNTER — Other Ambulatory Visit: Payer: Self-pay | Admitting: Family

## 2017-08-07 DIAGNOSIS — F419 Anxiety disorder, unspecified: Secondary | ICD-10-CM

## 2017-08-08 NOTE — Telephone Encounter (Addendum)
Last alprazolam RX: 06/02/17, #30 Last OV: 9/10./18 Next OV: was due 07/17/17. Now past due UDS: 11/08/16, moderate. Past due CSC: 06/30/15. Past due CSR:  No discrepancies identified

## 2017-08-09 NOTE — Telephone Encounter (Signed)
Mychart message sent to pt. Awaiting response.

## 2017-08-09 NOTE — Telephone Encounter (Signed)
Needs OV prior to refills please.

## 2017-08-15 MED FILL — AMLODIPINE BESYLATE 10 MG T: 10 | 30 days supply | Qty: 30 | Fill #2

## 2017-09-05 ENCOUNTER — Encounter: Payer: Self-pay | Admitting: Family

## 2017-09-05 ENCOUNTER — Other Ambulatory Visit: Payer: Self-pay | Admitting: Family

## 2017-09-05 DIAGNOSIS — H409 Unspecified glaucoma: Secondary | ICD-10-CM | POA: Insufficient documentation

## 2017-09-05 MED ORDER — LIFITEGRAST 5 % OP SOLN: 1.0000 [drp] | Freq: Two times a day (BID) | OPHTHALMIC | Status: DC

## 2017-09-16 MED FILL — POTASSIUM CL ER 20 MEQ TAB: 20 | 30 days supply | Qty: 30 | Fill #2

## 2017-09-30 LAB — HM MAMMOGRAPHY

## 2017-10-05 ENCOUNTER — Encounter: Payer: Self-pay | Admitting: Family

## 2017-10-07 ENCOUNTER — Encounter: Payer: Self-pay | Admitting: Family

## 2017-10-07 ENCOUNTER — Ambulatory Visit: Payer: PRIVATE HEALTH INSURANCE | Admitting: Family

## 2017-10-07 VITALS — BP 155/90 | HR 72 | Temp 97.8°F | Ht 60.0 in | Wt 156.2 lb

## 2017-10-07 DIAGNOSIS — I10 Essential (primary) hypertension: Secondary | ICD-10-CM

## 2017-10-07 DIAGNOSIS — F419 Anxiety disorder, unspecified: Secondary | ICD-10-CM | POA: Diagnosis not present

## 2017-10-07 DIAGNOSIS — Z0000 Encounter for general adult medical examination without abnormal findings: Secondary | ICD-10-CM | POA: Diagnosis not present

## 2017-10-07 LAB — BASIC METABOLIC PANEL
BUN: 14 mg/dL (ref 6–23)
CO2: 31 meq/L (ref 19–32)
CREATININE: 0.68 mg/dL (ref 0.40–1.20)
Calcium: 9.4 mg/dL (ref 8.4–10.5)
Chloride: 103 mEq/L (ref 96–112)
GFR: 95.63 mL/min (ref >60.00)
Glucose, Bld: 98 mg/dL (ref 70–99)
POTASSIUM: 3.5 meq/L (ref 3.5–5.1)
Sodium: 141 mEq/L (ref 135–145)

## 2017-10-07 MED ORDER — AMLODIPINE BESYLATE 10 MG PO TABS: 10.0000 mg | ORAL_TABLET | Freq: Every day | ORAL | 1 refills | Status: DC

## 2017-10-07 MED ORDER — LOSARTAN POTASSIUM-HCTZ 100-25 MG PO TABS: 1.0000 | ORAL_TABLET | Freq: Every day | ORAL | 1 refills | Status: DC

## 2017-10-07 MED ORDER — POTASSIUM CHLORIDE CRYS ER 20 MEQ PO TBCR: 20.0000 meq | EXTENDED_RELEASE_TABLET | Freq: Every day | ORAL | 1 refills | Status: DC

## 2017-10-07 MED ORDER — ALPRAZOLAM 0.5 MG PO TABS: ORAL_TABLET | ORAL | 0 refills | Status: DC

## 2017-10-07 MED FILL — AMLODIPINE BESYLATE 10 MG T: 10 | 90 days supply | Qty: 90 | Fill #0

## 2017-10-07 MED FILL — LOSARTAN-HCTZ 100-25 MG TAB: 100-25 | 90 days supply | Qty: 90 | Fill #0

## 2017-10-07 MED FILL — ALPRAZolam 0.5 MG TABS: 0.5 | 30 days supply | Qty: 30 | Fill #0

## 2017-10-07 NOTE — Patient Instructions (Addendum)
Please complete lab work prior to leaving.   

## 2017-10-07 NOTE — Progress Notes (Signed)
Subjective:    Patient ID: Anna Werner, female    DOB: 05-21-1960, 57 y.o.   MRN: [id number]  HPI  [name] is a 57 yr old female who presents today for follow up.  1) Anxiety/depression- maintained on prn xanax. Reports that she uses prn sleep and rarely during the day if she has anxiety symptoms.    2) Hypertension- on losartan-hcz and amlodipine. Ran out of amlodipine.    BP Readings from Last 3 Encounters:  10/07/17 (!) 155/90  01/17/17 120/68  01/11/17 117/74     Review of Systems See HPI  Past Medical History:  Diagnosis Date  . Anxiety   . Arthritis   . Depression   . Glaucoma   . HSV-2 infection   . Hypertension   . Thyroid nodule    Dr Elvera LennoxGherghe     Social History   Socioeconomic History  . Marital status: Divorced    Spouse name: Not on file  . Number of children: Not on file  . Years of education: Not on file  . Highest education level: Not on file  Occupational History  . Not on file  Social Needs  . Financial resource strain: Not on file  . Food insecurity:    Worry: Not on file    Inability: Not on file  . Transportation needs:    Medical: Not on file    Non-medical: Not on file  Tobacco Use  . Smoking status: Former Smoker    Types: Cigarettes  . Smokeless tobacco: Never Used  Substance and Sexual Activity  . Alcohol use: Yes    Alcohol/week: 4.2 oz    Types: 7 Glasses of wine per week    Comment: 1 per night  . Drug use: No  . Sexual activity: Yes  Lifestyle  . Physical activity:    Days per week: Not on file    Minutes per session: Not on file  . Stress: Not on file  Relationships  . Social connections:    Talks on phone: Not on file    Gets together: Not on file    Attends religious service: Not on file    Active member of club or organization: Not on file    Attends meetings of clubs or organizations: Not on file    Relationship status: Not on file  . Intimate partner violence:    Fear of current or ex partner: Not  on file    Emotionally abused: Not on file    Physically abused: Not on file    Forced sexual activity: Not on file  Other Topics Concern  . 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    Enjoys reading and spending time with grandson    Lives alone       Past Surgical History:  Procedure Laterality Date  . ABDOMINAL HYSTERECTOMY    . BIOPSY THYROID  05/10/13  . BREAST BIOPSY Left ?  Marland Kitchen. BREAST LUMPECTOMY Right 2012?  . HERNIA REPAIR      Family History  Problem Relation Age of Onset  . Alcohol abuse Mother     No Known Allergies  Current Outpatient Medications on File Prior to Visit  Medication Sig Dispense Refill  . ALPRAZolam (XANAX) 0.5 MG tablet TAKE 1 TABLET(S) BY MOUTH DAILY AS NEEDED FOR  ANXIETY 30 tablet 0  . amLODipine (NORVASC) 10 MG tablet TAKE 1 TABLET (10 MG TOTAL) BY MOUTH DAILY. 30 tablet 2  . calcium carbonate 200 MG capsule Take 500 mg by mouth 2 (two) times daily with a meal.    . cetirizine (ZYRTEC) 10 MG tablet Take 10 mg by mouth as needed.     . cholecalciferol (VITAMIN D) 1000 UNITS tablet Take 1,000 Units by mouth daily.    Marland Kitchen Lifitegrast (XIIDRA) 5 % SOLN Apply 1 drop to eye 2 (two) times daily.    Marland Kitchen losartan-hydrochlorothiazide (HYZAAR) 100-25 MG tablet TAKE 1 TABLET BY MOUTH DAILY. 90 tablet 1  . MAGNESIUM CITRATE PO Take by mouth. Take 1/4 teaspoon once a day.    . potassium chloride SA (K-DUR,KLOR-CON) 20 MEQ tablet Take 1 tablet (20 mEq total) by mouth daily. 30 tablet 5  . Vitamin D-Vitamin K (VITAMIN K2-VITAMIN D3 PO) Take 1 capsule by mouth daily.     No current facility-administered medications on file prior to visit.     BP (!) 155/90 (BP Location: Right Arm, Patient Position: Sitting, Cuff Size: Small)   Pulse 72   Temp 97.8 F (36.6 C) (Oral)   Ht 5' (1.524 m)   Wt 156 lb 3.2 oz (70.9 kg)   HC 3" (7.6 cm)    SpO2 (!) 16%   PF 100 L/min   BMI 30.51 kg/m       Objective:   Physical Exam  Constitutional: She is oriented to person, place, and time. She appears well-developed and well-nourished.  Cardiovascular: Normal rate, regular rhythm and normal heart sounds.  No murmur heard. Pulmonary/Chest: Effort normal and breath sounds normal. No respiratory distress. She has no wheezes.  Neurological: She is alert and oriented to person, place, and time.  Skin: Skin is warm and dry.  Psychiatric: She has a normal mood and affect. Her behavior is normal. Judgment and thought content normal.          Assessment & Plan:  HTN- uncontrolled, restart amlodipine. Continue losartan-hctz. She will follow up in July for cpx and we will plan to repeat blood pressure at that time.  Obtain follow up bmet.   Anxiety- stable.  A controlled substance contract is signed today.  Obtain UDS.  Reviewed controlled substance registry.  Refills appropriate.

## 2017-10-08 LAB — PAIN MGMT, PROFILE 8 W/CONF, U
6 ACETYLMORPHINE: NEGATIVE ng/mL (ref <10)
Alcohol Metabolites: NEGATIVE ng/mL (ref <500)
Amphetamines: NEGATIVE ng/mL (ref <500)
BENZODIAZEPINES: NEGATIVE ng/mL (ref <100)
BUPRENORPHINE, URINE: NEGATIVE ng/mL (ref <5)
CREATININE: 25.4 mg/dL
Cocaine Metabolite: NEGATIVE ng/mL (ref <150)
MARIJUANA METABOLITE: NEGATIVE ng/mL (ref <20)
MDMA: NEGATIVE ng/mL (ref <500)
Opiates: NEGATIVE ng/mL (ref <100)
Oxidant: NEGATIVE ug/mL (ref <200)
Oxycodone: NEGATIVE ng/mL (ref <100)
PH: 7.02 (ref 4.5–9.0)

## 2017-10-24 MED FILL — POTASSIUM CL ER 20 MEQ TAB: 20 | 30 days supply | Qty: 30 | Fill #3

## 2017-11-17 ENCOUNTER — Ambulatory Visit (INDEPENDENT_AMBULATORY_CARE_PROVIDER_SITE_OTHER): Payer: PRIVATE HEALTH INSURANCE | Admitting: Family

## 2017-11-17 ENCOUNTER — Encounter: Payer: Self-pay | Admitting: Family

## 2017-11-17 VITALS — BP 132/74 | HR 68 | Temp 98.2°F | Resp 18 | Ht 62.5 in | Wt 159.8 lb

## 2017-11-17 DIAGNOSIS — Z0000 Encounter for general adult medical examination without abnormal findings: Secondary | ICD-10-CM | POA: Diagnosis not present

## 2017-11-17 DIAGNOSIS — E348 Other specified endocrine disorders: Secondary | ICD-10-CM

## 2017-11-17 NOTE — Progress Notes (Signed)
Subjective:    Patient ID: Anna Werner, female    DOB: [birth year], 57 y.o.   MRN: [id number]  HPI   Patient presents today for complete physical.  Immunizations: tdap 2014 Diet:needs improvement Exercise: stopped going to the GYM, plans to restart Colonoscopy: 10/05/17 Dexa: due Pap Smear: hysterectomy Mammogram:5/19 Vision:  2 months ago Dental: 6/19 Wt Readings from Last 3 Encounters:  11/17/17 159 lb 12.8 oz (72.5 kg)  10/07/17 156 lb 3.2 oz (70.9 kg)  01/17/17 151 lb 12.8 oz (68.9 kg)        Review of Systems  Constitutional: Negative for unexpected weight change.  HENT: Negative for hearing loss and rhinorrhea.   Eyes: Negative for visual disturbance.  Respiratory: Negative for cough and shortness of breath.   Cardiovascular: Negative for chest pain and leg swelling.  Gastrointestinal: Negative for blood in stool, constipation, diarrhea and nausea.  Genitourinary: Negative for dysuria and frequency.  Musculoskeletal:       Some arthritis pain in knees/back/neck/shoulders  Skin: Negative for rash.  Neurological: Negative for headaches.  Hematological: Negative for adenopathy.  Psychiatric/Behavioral:       Denies depression/anxiety    Past Medical History:  Diagnosis Date  . Anxiety   . Arthritis   . Depression   . Glaucoma   . HSV-2 infection   . Hypertension   . Thyroid nodule    Dr Elvera LennoxGherghe     Social History   Socioeconomic History  . Marital status: Divorced    Spouse name: Not on file  . Number of children: Not on file  . Years of education: Not on file  . Highest education level: Not on file  Occupational History  . Not on file  Social Needs  . Financial resource strain: Not on file  . Food insecurity:    Worry: Not on file    Inability: Not on file  . Transportation needs:    Medical: Not on file    Non-medical: Not on file  Tobacco Use  . Smoking status: Former Smoker    Types: Cigarettes  . Smokeless tobacco: Never Used    Substance and Sexual Activity  . Alcohol use: Not Currently    Alcohol/week: 0.0 oz    Comment: 0  . Drug use: No  . Sexual activity: Yes  Lifestyle  . Physical activity:    Days per week: Not on file    Minutes per session: Not on file  . Stress: Not on file  Relationships  . Social connections:    Talks on phone: Not on file    Gets together: Not on file    Attends religious service: Not on file    Active member of club or organization: Not on file    Attends meetings of clubs or organizations: Not on file    Relationship status: Not on file  . Intimate partner violence:    Fear of current or ex partner: Not on file    Emotionally abused: Not on file    Physically abused: Not on file    Forced sexual activity: Not on file  Other Topics Concern  . 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    Enjoys reading and spending time with grandson    Lives alone       Past Surgical  History:  Procedure Laterality Date  . ABDOMINAL HYSTERECTOMY    . BIOPSY THYROID  05/10/13  . BREAST BIOPSY Left ?  Marland Kitchen BREAST LUMPECTOMY Right 2012?  . HERNIA REPAIR      Family History  Problem Relation Age of Onset  . Alcohol abuse Mother     No Known Allergies  Current Outpatient Medications on File Prior to Visit  Medication Sig Dispense Refill  . ALPRAZolam (XANAX) 0.5 MG tablet TAKE 1 TABLET(S) BY MOUTH DAILY AS NEEDED FOR ANXIETY 30 tablet 0  . amLODipine (NORVASC) 10 MG tablet Take 1 tablet (10 mg total) by mouth daily. 90 tablet 1  . calcium carbonate 200 MG capsule Take 500 mg by mouth 2 (two) times daily with a meal.    . cetirizine (ZYRTEC) 10 MG tablet Take 10 mg by mouth as needed.     . cholecalciferol (VITAMIN D) 1000 UNITS tablet Take 1,000 Units by mouth daily.    Marland Kitchen Lifitegrast (XIIDRA) 5 % SOLN Apply 1 drop to eye 2 (two) times daily.    Marland Kitchen  losartan-hydrochlorothiazide (HYZAAR) 100-25 MG tablet Take 1 tablet by mouth daily. 90 tablet 1  . MAGNESIUM CITRATE PO Take by mouth. Take 1/4 teaspoon once a day.    . potassium chloride SA (K-DUR,KLOR-CON) 20 MEQ tablet Take 1 tablet (20 mEq total) by mouth daily. 90 tablet 1  . Vitamin D-Vitamin K (VITAMIN K2-VITAMIN D3 PO) Take 1 capsule by mouth daily.     No current facility-administered medications on file prior to visit.     BP 132/74 (BP Location: Right Arm, Cuff Size: Normal)   Pulse 68   Temp 98.2 F (36.8 C) (Oral)   Resp 18   Ht 5' 2.5" (1.588 m)   Wt 159 lb 12.8 oz (72.5 kg)   SpO2 100%   BMI 28.76 kg/m    Objective:   Physical Exam  Physical Exam  Constitutional: She is oriented to person, place, and time. She appears well-developed and well-nourished. No distress.  HENT:  Head: Normocephalic and atraumatic.  Right Ear: Tympanic membrane and ear canal normal.  Left Ear: Tympanic membrane and ear canal normal.  Mouth/Throat: Oropharynx is clear and moist.  Eyes: Pupils are equal, round, and reactive to light. No scleral icterus.  Neck: Normal range of motion. No thyromegaly present.  Cardiovascular: Normal rate and regular rhythm.   No murmur heard. Pulmonary/Chest: Effort normal and breath sounds normal. No respiratory distress. He has no wheezes. She has no rales. She exhibits no tenderness.  Abdominal: Soft. Bowel sounds are normal. She exhibits no distension and no mass. There is no tenderness. There is no rebound and no guarding.  Musculoskeletal: She exhibits no edema.  Lymphadenopathy:    She has no cervical adenopathy.  Neurological: She is alert and oriented to person, place, and time. She has normal patellar reflexes. She exhibits normal muscle tone. Coordination normal.  Skin: Skin is warm and dry.  Psychiatric: She has a normal mood and affect. Her behavior is normal. Judgment and thought content normal.  Breasts: Examined lying Right: Without  masses, retractions, discharge or axillary adenopathy.  Left: Without masses, retractions, discharge or axillary adenopathy. Pelvic: deferred          Assessment & Plan:   Preventative care- discussed healthy diet, exercise, weight loss. Refer for dexa. Tetanus up to date.       Assessment & Plan:

## 2017-11-17 NOTE — Patient Instructions (Addendum)
Please complete lab work prior to leaving. Continue healthy diet, exercise and weight loss efforts.  

## 2017-11-18 ENCOUNTER — Ambulatory Visit (HOSPITAL_BASED_OUTPATIENT_CLINIC_OR_DEPARTMENT_OTHER)
Admission: RE | Admit: 2017-11-18 | Discharge: 2017-11-18 | Disposition: A | Discharge status: Home / Self Care | Payer: PRIVATE HEALTH INSURANCE | Source: Ambulatory Visit | Attending: Family | Admitting: Family

## 2017-11-18 DIAGNOSIS — Z13820 Encounter for screening for osteoporosis: Secondary | ICD-10-CM | POA: Insufficient documentation

## 2017-11-18 DIAGNOSIS — M85852 Other specified disorders of bone density and structure, left thigh: Secondary | ICD-10-CM | POA: Diagnosis not present

## 2017-11-18 DIAGNOSIS — E348 Other specified endocrine disorders: Secondary | ICD-10-CM

## 2017-11-18 LAB — HEPATIC FUNCTION PANEL
ALT: 17 U/L (ref 0–35)
AST: 16 U/L (ref 0–37)
Albumin: 4.5 g/dL (ref 3.5–5.2)
Alkaline Phosphatase: 103 U/L (ref 39–117)
BILIRUBIN TOTAL: 0.3 mg/dL (ref 0.2–1.2)
Bilirubin, Direct: 0 mg/dL (ref 0.0–0.3)
TOTAL PROTEIN: 7.1 g/dL (ref 6.0–8.3)

## 2017-11-18 LAB — URINALYSIS, ROUTINE W REFLEX MICROSCOPIC
Bilirubin Urine: NEGATIVE
Hgb urine dipstick: NEGATIVE
KETONES UR: NEGATIVE
Leukocytes, UA: NEGATIVE
Nitrite: NEGATIVE
PH: 6.5 (ref 5.0–8.0)
RBC / HPF: NONE SEEN (ref 0–?)
SPECIFIC GRAVITY, URINE: 1.01 (ref 1.000–1.030)
Total Protein, Urine: NEGATIVE
URINE GLUCOSE: NEGATIVE
UROBILINOGEN UA: 0.2 (ref 0.0–1.0)

## 2017-11-18 LAB — BASIC METABOLIC PANEL
BUN: 13 mg/dL (ref 6–23)
CHLORIDE: 98 meq/L (ref 96–112)
CO2: 32 mEq/L (ref 19–32)
Calcium: 9.4 mg/dL (ref 8.4–10.5)
Creatinine, Ser: 0.66 mg/dL (ref 0.40–1.20)
GFR: 98.94 mL/min (ref >60.00)
Glucose, Bld: 153 mg/dL — ABNORMAL HIGH (ref 70–99)
POTASSIUM: 3.5 meq/L (ref 3.5–5.1)
Sodium: 141 mEq/L (ref 135–145)

## 2017-11-18 LAB — LIPID PANEL
CHOL/HDL RATIO: 3
CHOLESTEROL: 220 mg/dL — AB (ref 0–200)
HDL: 80.9 mg/dL (ref >39.00)
LDL CALC: 119 mg/dL — AB (ref 0–99)
NonHDL: 139.26
Triglycerides: 100 mg/dL (ref 0.0–149.0)
VLDL: 20 mg/dL (ref 0.0–40.0)

## 2017-11-18 LAB — CBC WITH DIFFERENTIAL/PLATELET
BASOS PCT: 0.8 % (ref 0.0–3.0)
Basophils Absolute: 0.1 10*3/uL (ref 0.0–0.1)
EOS PCT: 1.8 % (ref 0.0–5.0)
Eosinophils Absolute: 0.1 10*3/uL (ref 0.0–0.7)
HCT: 39.4 % (ref 36.0–46.0)
HEMOGLOBIN: 12.7 g/dL (ref 12.0–15.0)
Lymphocytes Relative: 36.2 % (ref 12.0–46.0)
Lymphs Abs: 2.9 10*3/uL (ref 0.7–4.0)
MCHC: 32.3 g/dL (ref 30.0–36.0)
MCV: 75.6 fl — ABNORMAL LOW (ref 78.0–100.0)
MONO ABS: 0.5 10*3/uL (ref 0.1–1.0)
MONOS PCT: 6.3 % (ref 3.0–12.0)
Neutro Abs: 4.3 10*3/uL (ref 1.4–7.7)
Neutrophils Relative %: 54.9 % (ref 43.0–77.0)
Platelets: 266 10*3/uL (ref 150.0–400.0)
RBC: 5.21 Mil/uL — AB (ref 3.87–5.11)
RDW: 15.5 % (ref 11.5–15.5)
WBC: 7.9 10*3/uL (ref 4.0–10.5)

## 2017-11-18 LAB — TSH: TSH: 0.65 u[IU]/mL (ref 0.35–4.50)

## 2017-11-20 ENCOUNTER — Telehealth: Payer: Self-pay | Admitting: Family

## 2017-11-20 ENCOUNTER — Encounter: Payer: Self-pay | Admitting: Family

## 2017-11-20 DIAGNOSIS — M8580 Other specified disorders of bone density and structure, unspecified site: Secondary | ICD-10-CM

## 2017-11-20 HISTORY — DX: Other specified disorders of bone density and structure, unspecified site: M85.80

## 2017-11-20 NOTE — Telephone Encounter (Signed)
Bone density shows mild osteopenia (bone thinning). Continue calcium supplement,weight bearing exercise, needs vit D level drawn please. Dx osteopenia.

## 2017-11-20 NOTE — Telephone Encounter (Signed)
Also, please let pt know that sugar was elevated but not in the diabetic range. Healthy diet (reducing sugar and carbs as we discussed at her appointment) exercise and weight loss can help her sugar. Can you please ask lab to add on A1C?

## 2017-11-21 ENCOUNTER — Other Ambulatory Visit (INDEPENDENT_AMBULATORY_CARE_PROVIDER_SITE_OTHER): Payer: PRIVATE HEALTH INSURANCE

## 2017-11-21 ENCOUNTER — Encounter: Payer: Self-pay | Admitting: Family

## 2017-11-21 DIAGNOSIS — R739 Hyperglycemia, unspecified: Secondary | ICD-10-CM

## 2017-11-21 LAB — HEMOGLOBIN A1C: HEMOGLOBIN A1C: 6.1 % (ref 4.6–6.5)

## 2017-11-21 NOTE — Telephone Encounter (Signed)
A1C shows pre-diabetes.  Recommendations as below.

## 2017-11-21 NOTE — Telephone Encounter (Signed)
Hgb A1c has resulted. Please advise, then I will call pt.

## 2017-11-21 NOTE — Telephone Encounter (Signed)
Add on request has been faxed to the lab. ?

## 2017-11-22 ENCOUNTER — Encounter: Payer: Self-pay | Admitting: Family

## 2017-11-22 NOTE — Telephone Encounter (Signed)
Left detailed message on pt's voicemail regarding below labs and to call and schedule lab appt at her earliest convenience. Future lab order placed. [facility]t.

## 2017-11-23 ENCOUNTER — Other Ambulatory Visit (INDEPENDENT_AMBULATORY_CARE_PROVIDER_SITE_OTHER): Payer: PRIVATE HEALTH INSURANCE

## 2017-11-23 ENCOUNTER — Other Ambulatory Visit: Payer: Self-pay | Admitting: *Deleted

## 2017-11-23 DIAGNOSIS — M8580 Other specified disorders of bone density and structure, unspecified site: Secondary | ICD-10-CM | POA: Diagnosis not present

## 2017-11-23 NOTE — Telephone Encounter (Signed)
See 11/20/17 phone note.

## 2017-11-23 NOTE — Telephone Encounter (Signed)
Patient called and wanted results released.  Results were release and appointment made for the vitamin d.  She will be in today.

## 2017-11-24 LAB — VITAMIN D 25 HYDROXY (VIT D DEFICIENCY, FRACTURES): VITD: 42.73 ng/mL (ref 30.00–100.00)

## 2017-12-14 MED FILL — POTASSIUM CL ER 20 MEQ TAB: 20 | 30 days supply | Qty: 30 | Fill #4

## 2017-12-26 ENCOUNTER — Other Ambulatory Visit: Payer: Self-pay | Admitting: Family

## 2017-12-26 DIAGNOSIS — F419 Anxiety disorder, unspecified: Secondary | ICD-10-CM

## 2017-12-26 MED FILL — ALPRAZolam 0.5 MG TABS: 0.5 | 30 days supply | Qty: 30 | Fill #0

## 2017-12-26 NOTE — Telephone Encounter (Signed)
Please advise in PCP's absence?  Last alprazolam RX: 10/07/17, #30 Last OV: 11/17/17 Next OV: 05/22/18 UDS: 10/07/17, moderate risk CSC: 10/07/17 CSR: No discrepancies identified

## 2017-12-29 MED FILL — AMLODIPINE BESYLATE 10 MG T: 10 | 90 days supply | Qty: 90 | Fill #1

## 2017-12-29 MED FILL — LOSARTAN-HCTZ 100-25 MG TAB: 100-25 | 90 days supply | Qty: 90 | Fill #1

## 2018-01-20 MED FILL — POTASSIUM CL ER 20 MEQ TAB: 20 | 30 days supply | Qty: 30 | Fill #5

## 2018-01-31 ENCOUNTER — Other Ambulatory Visit: Payer: Self-pay | Admitting: Family Medicine

## 2018-01-31 DIAGNOSIS — F419 Anxiety disorder, unspecified: Secondary | ICD-10-CM

## 2018-01-31 MED FILL — ALPRAZolam 0.5 MG TABS: 0.5 | 30 days supply | Qty: 30 | Fill #0

## 2018-01-31 NOTE — Telephone Encounter (Signed)
Requesting:xanax Contract:yes UDS:10/07/17 moderate risk  Last OV:11/17/17 Next OV:05/22/18 Last Refill:12/26/17 #30-0rf Database:   Please advise

## 2018-03-30 ENCOUNTER — Other Ambulatory Visit: Payer: Self-pay | Admitting: Family

## 2018-03-30 MED FILL — LOSARTAN-HCTZ 100-25 MG TAB: 100-25 | 90 days supply | Qty: 90 | Fill #0

## 2018-03-30 MED FILL — POTASSIUM CL ER 20 MEQ TAB: 20 | 90 days supply | Qty: 90 | Fill #0

## 2018-03-30 MED FILL — AMLODIPINE BESYLATE 10 MG T: 10 | 90 days supply | Qty: 90 | Fill #0

## 2018-05-22 ENCOUNTER — Ambulatory Visit: Payer: PRIVATE HEALTH INSURANCE | Admitting: Family

## 2018-06-22 IMAGING — CT CT HEAD W/O CM
3 series · 16 of 47 positions shown, 19 images · non-contrast
Comparison: None.

CLINICAL DATA: Visual disturbance on the right. Pain. Left-sided
numbness. Near syncope.

EXAM:
CT HEAD WITHOUT CONTRAST
TECHNIQUE: Contiguous axial images were obtained from the base of the skull
through the vertex without intravenous contrast.

[Series 2: head wo · axial · 0.41mm/px · z∈[+1115,+1250]mm · 10 of 33 slices shown, 13 images]
[im 3/33  brain]
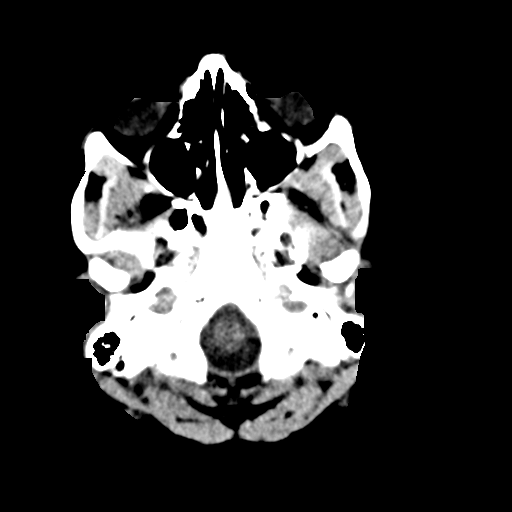
[im 3/33  bone]
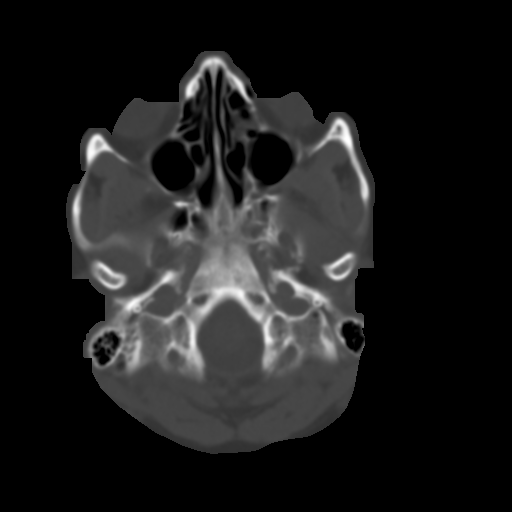
[im 6/33  brain]
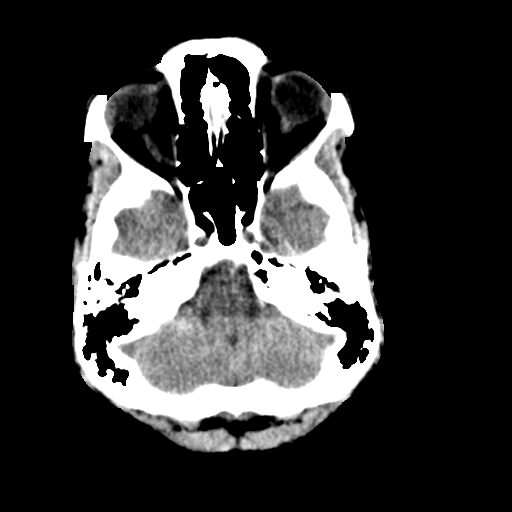
[im 9/33  brain]
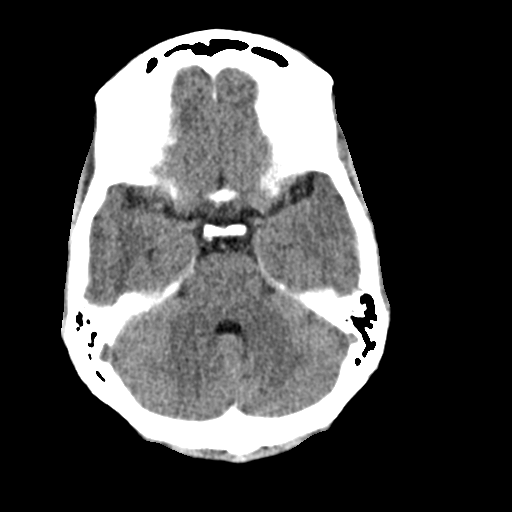
[im 12/33  brain]
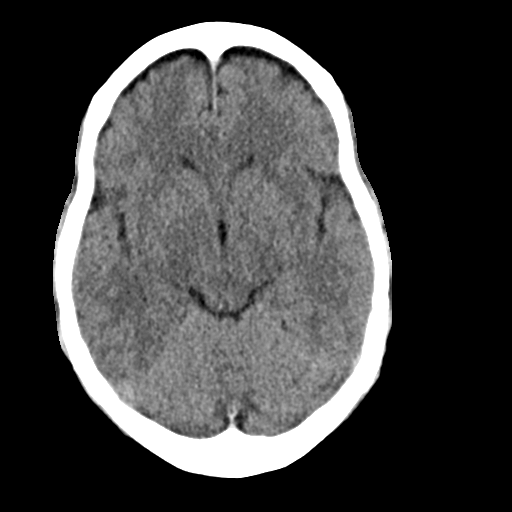
[im 15/33  brain]
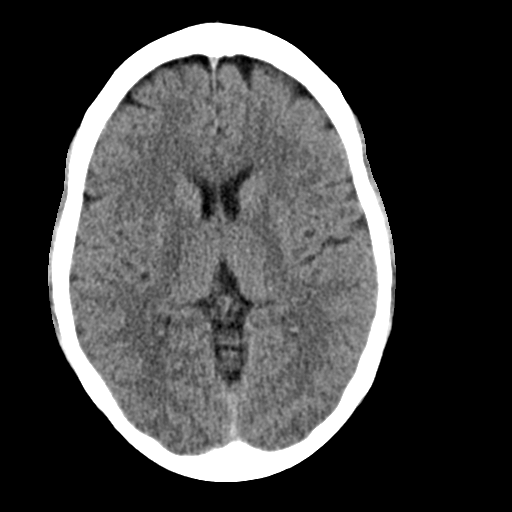
[im 15/33  bone]
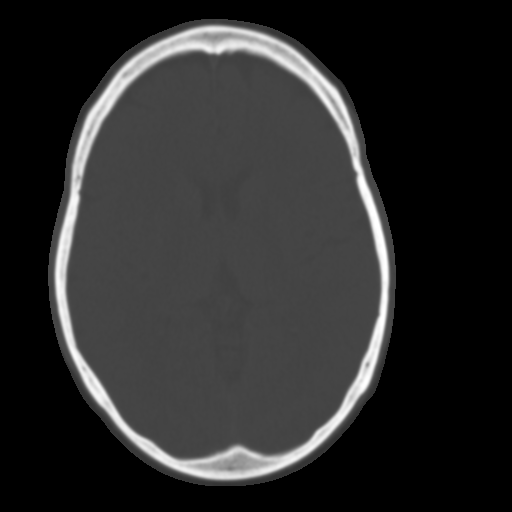
[im 18/33  brain]
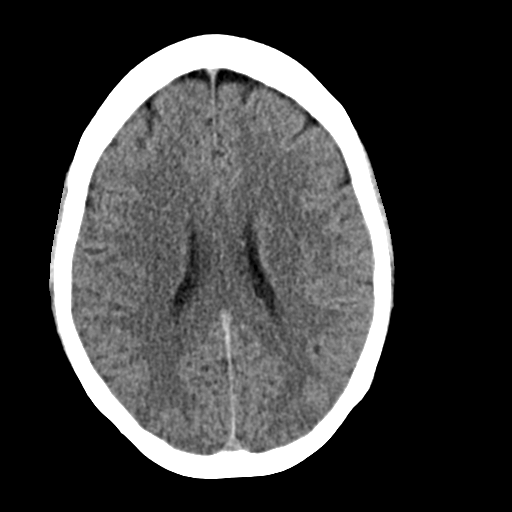
[im 21/33  brain]
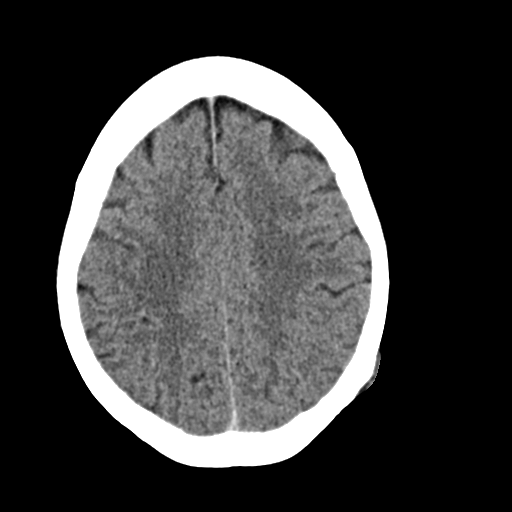
[im 25/33  brain]
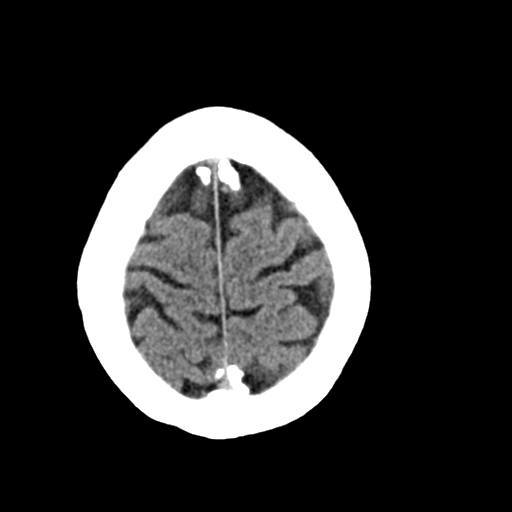
[im 27/33  brain]
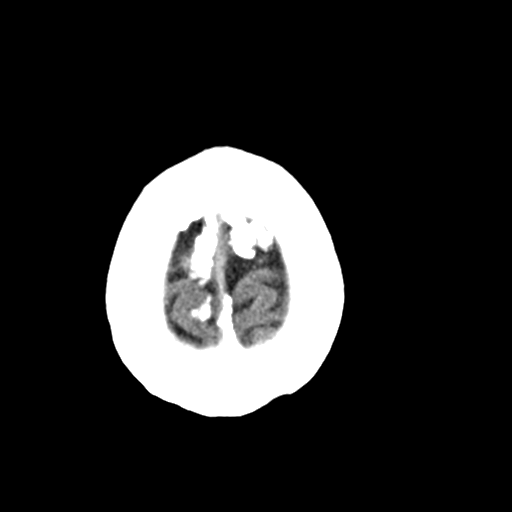
[im 27/33  bone]
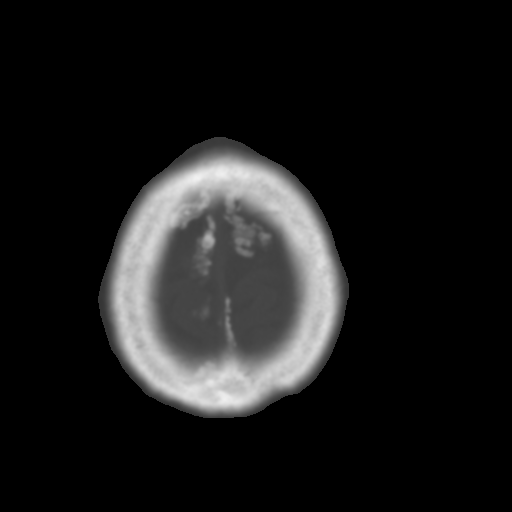
[im 30/33  brain]
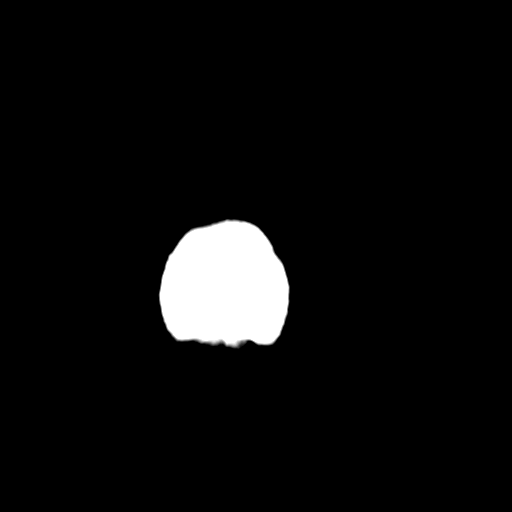

[Series 4: coronal soft · coronal · 0.31mm/px · 3 of 67 slices shown]
[im 23/67  brain]
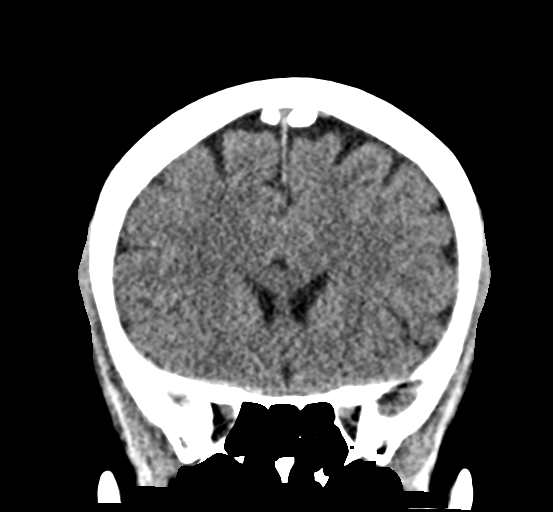
[im 30/67  brain]
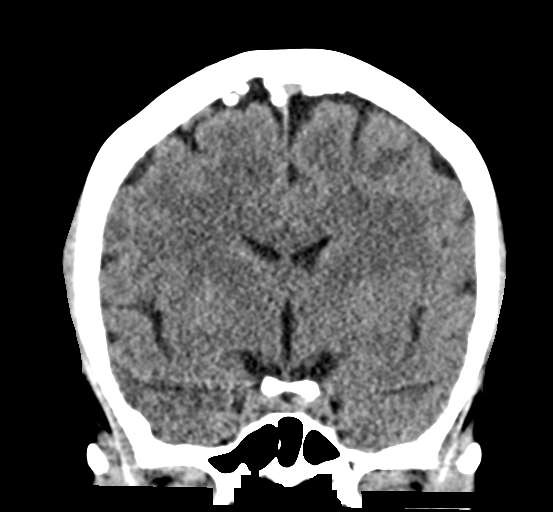
[im 37/67  brain]
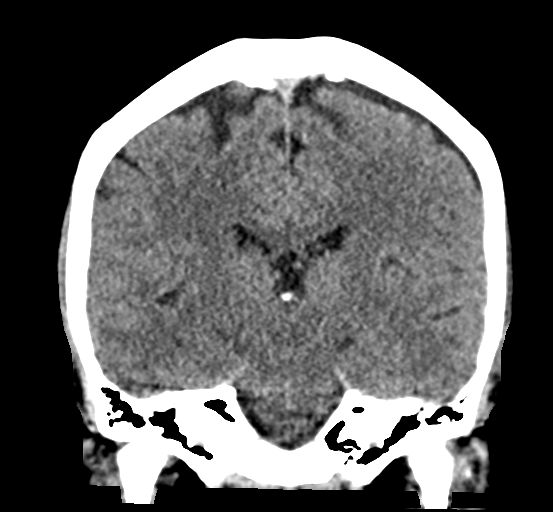

[Series 5: sag soft · sagittal · 0.31mm/px · 3 of 59 slices shown]
[im 20/59  brain]
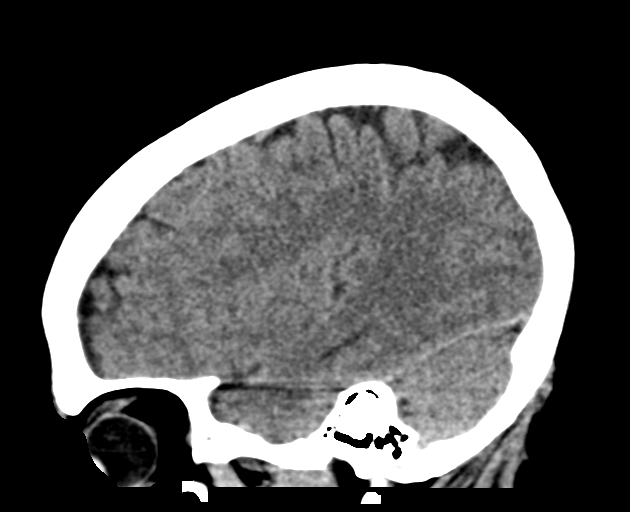
[im 30/59  brain]
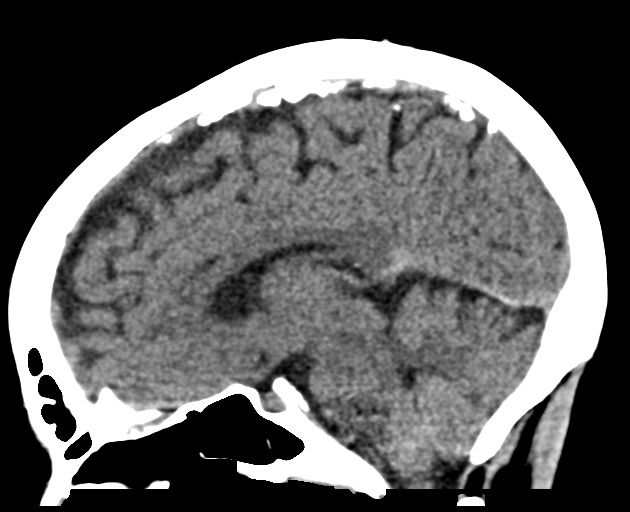
[im 39/59  brain]
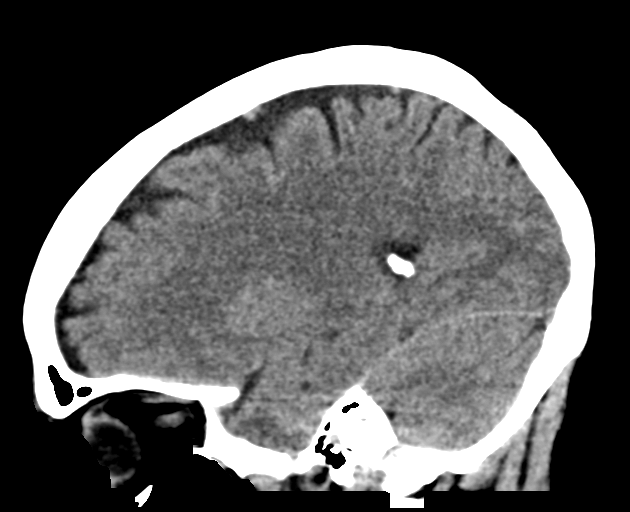

[16 of 47 positions shown; findings below may reference images not displayed]

FINDINGS: Brain: Normal appearance without evidence of old or acute
infarction, mass lesion, hemorrhage, hydrocephalus or extra-axial
collection.

Vascular: No abnormal vascular finding.

Skull: No calvarial lesion. Small soft tissue prominence of the left
parietal scalp which is nonspecific. This could be a hemangioma or
other soft tissue tumor, likely benign. The underlying calvarium
appears normal.

Sinuses/Orbits: Normal sinuses. Mastoids are clear. No orbital
lesion visible.

Other: None
IMPRESSION: No cause of the clinical presentation is identified. Normal
intracranial examination.

Soft tissue prominence, possibly subgaleal, in the left parietal
scalp likely to represent a benign entity, nonspecific.

## 2018-06-28 ENCOUNTER — Ambulatory Visit: Payer: PRIVATE HEALTH INSURANCE | Admitting: Medical

## 2018-06-28 ENCOUNTER — Encounter: Payer: Self-pay | Admitting: Medical

## 2018-06-28 VITALS — BP 116/82 | HR 63 | Temp 98.2°F | Resp 16 | Ht 62.0 in | Wt 147.4 lb

## 2018-06-28 DIAGNOSIS — B349 Viral infection, unspecified: Secondary | ICD-10-CM | POA: Diagnosis not present

## 2018-06-28 DIAGNOSIS — M791 Myalgia, unspecified site: Secondary | ICD-10-CM | POA: Diagnosis not present

## 2018-06-28 DIAGNOSIS — R5383 Other fatigue: Secondary | ICD-10-CM | POA: Diagnosis not present

## 2018-06-28 DIAGNOSIS — R0981 Nasal congestion: Secondary | ICD-10-CM | POA: Diagnosis not present

## 2018-06-28 MED ORDER — AZITHROMYCIN 250 MG PO TABS: ORAL_TABLET | ORAL | 0 refills | Status: DC

## 2018-06-28 MED FILL — LOSARTAN-HCTZ 100-25 MG TAB: 100-25 | 60 days supply | Qty: 60 | Fill #1

## 2018-06-28 MED FILL — AZITHROMYCIN 250 MG TABLET: 250 | 5 days supply | Qty: 6 | Fill #0

## 2018-06-28 NOTE — Progress Notes (Signed)
Subjective:    Patient ID: Anna Werner, female    DOB: [birth year], 58 y.o.   MRN: [id number]  HPI  Pt states last week she spiked a temp about 101 and had diffuse body aches with chills. Pt did not get flu vaccine. She went to urgent care and had flu test done. Test was negative. She stated by Sunday body aches went away. But her energy has not bounced back. She did have slight st. Also at time of UC evluation had lymph node swollen on side of neck.  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    Review of Systems  Constitutional: Positive for chills, fatigue and fever.       Still has fatigue. Chills and fever resolved.  HENT: Positive for congestion and sore throat.   Respiratory: Positive for cough. Negative for choking, shortness of breath and wheezing.        She is coughing up some mucus.  Cardiovascular: Negative for chest pain and palpitations.  Gastrointestinal: Negative for abdominal pain.  Musculoskeletal: Positive for myalgias. Negative for back pain.       See hpi.  Neurological: Negative for dizziness, seizures, weakness and headaches.  Hematological: Positive for adenopathy.       See hpi.  Psychiatric/Behavioral: Negative for behavioral problems and decreased concentration.    Past Medical History:  Diagnosis Date  . Anxiety   . Arthritis   . Depression   . Glaucoma   . HSV-2 infection   . Hypertension   . Osteopenia 11/20/2017  . Thyroid nodule    Dr Elvera Lennox     Social History   Socioeconomic History  . Marital status: Divorced    Spouse name: Not on file  . Number of children: Not on file  . Years of education: Not on file  . Highest education level: Not on file  Occupational History  . Not on file  Social Needs  . Financial resource strain: Not on file  . Food insecurity:    Worry: Not on file    Inability: Not on file  . Transportation needs:    Medical: Not on file    Non-medical: Not on file  Tobacco Use  . Smoking status: Former  Smoker    Types: Cigarettes  . Smokeless tobacco: Never Used  Substance and Sexual Activity  . Alcohol use: Not Currently    Alcohol/week: 0.0 standard drinks    Comment: 0  . Drug use: No  . Sexual activity: Yes  Lifestyle  . Physical activity:    Days per week: Not on file    Minutes per session: Not on file  . Stress: Not on file  Relationships  . Social connections:    Talks on phone: Not on file    Gets together: Not on file    Attends religious service: Not on file    Active member of club or organization: Not on file    Attends meetings of clubs or organizations: Not on file    Relationship status: Not on file  . Intimate partner violence:    Fear of current or ex partner: Not on file    Emotionally abused: Not on file    Physically abused: Not on file    Forced sexual activity: Not on file  Other Topics Concern  . Not on file  Social History Narrative   Regular exercise: no   Caffeine use: coffee daily   Daughter in [place]- has son  Son in [place]   Works as an Public house manager at [employer].    Enjoys reading and spending time with grandson    Lives alone       Past Surgical History:  Procedure Laterality Date  . ABDOMINAL HYSTERECTOMY    . BIOPSY THYROID  05/10/13  . BREAST BIOPSY Left ?  Marland Kitchen BREAST LUMPECTOMY Right 2012?  . HERNIA REPAIR      Family History  Problem Relation Age of Onset  . Alcohol abuse Mother   . Developmental delay Brother     No Known Allergies  Current Outpatient Medications on File Prior to Visit  Medication Sig Dispense Refill  . ALPRAZolam (XANAX) 0.5 MG tablet TAKE 1 TABLET BY MOUTH ONCE DAILY AS NEEDED FOR ANXIETY 30 tablet 0  . amLODipine (NORVASC) 10 MG tablet TAKE 1 TABLET (10 MG TOTAL) BY MOUTH DAILY. 90 tablet 1  . calcium carbonate 200 MG capsule Take 500 mg by mouth 2 (two) times daily with a meal.    . cetirizine (ZYRTEC) 10 MG tablet Take 10 mg by mouth as needed.     . cholecalciferol (VITAMIN D) 1000 UNITS tablet Take  1,000 Units by mouth daily.    Marland Kitchen losartan-hydrochlorothiazide (HYZAAR) 100-25 MG tablet TAKE 1 TABLET BY MOUTH DAILY. 90 tablet 1  . MAGNESIUM CITRATE PO Take by mouth. Take 1/4 teaspoon once a day.    . potassium chloride SA (K-DUR,KLOR-CON) 20 MEQ tablet Take 1 tablet (20 mEq total) by mouth daily. 90 tablet 1  . Vitamin D-Vitamin K (VITAMIN K2-VITAMIN D3 PO) Take 1 capsule by mouth daily.     No current facility-administered medications on file prior to visit.     BP 116/82   Pulse 63   Temp 98.2 F (36.8 C) (Oral)   Resp 16   Ht 5\' 2"  (1.575 m)   Wt 147 lb 6.4 oz (66.9 kg)   SpO2 100%   BMI 26.96 kg/m       Objective:   Physical Exam   General  Mental Status - Alert. General Appearance - Well groomed. Not in acute distress.  Skin Rashes- No Rashes.  HEENT Head- Normal. Ear Auditory Canal - Left- Normal. Right - Normal.Tympanic Membrane- Left- Normal. Right- Normal. Eye Sclera/Conjunctiva- Left- Normal. Right- Normal. Nose & Sinuses Nasal Mucosa- Left-  Boggy and Congested. Right-  Boggy and  Congested.Bilateral no  maxillary and no  frontal sinus pressure. Mouth & Throat Lips: Upper Lip- Normal: no dryness, cracking, pallor, cyanosis, or vesicular eruption. Lower Lip-Normal: no dryness, cracking, pallor, cyanosis or vesicular eruption. Buccal Mucosa- Bilateral- No Aphthous ulcers. Oropharynx- No Discharge or Erythema. Tonsils: Characteristics- Bilateral- No Erythema or Congestion. Size/Enlargement- Bilateral- No enlargement. Discharge- bilateral-None.  Neck Neck- Supple. No Masses.   Chest and Lung Exam Auscultation: Breath Sounds:-Clear even and unlabored.  Cardiovascular Auscultation:Rythm- Regular, rate and rhythm. Murmurs & Other Heart Sounds:Ausculatation of the heart reveal- No Murmurs.  Lymphatic Head & Neck General Head & Neck Lymphatics: Bilateral: Description- No Localized lymphadenopathy.      Assessment & Plan:  I do think that by your  description past week that you likely had the flu.  You had classic symptoms with rapid test that was negative.  Sometimes rapid flu test can give false negative results.  The nasal congestion and persisting fatigue I think is likely post flu.  Would recommend that you continue Flonase for nasal congestion and will try benzonatate for cough.  Sometimes secondary infection such as bronchitis, ear  infections, sinus infections and pneumonia can occur post flu.  No obvious indication of this presently.  But I am going to provide you with a print prescription of azithromycin to start if you have any of the symptoms.  If by this coming Monday you still do not feel significantly improved would recommend that you get CBC, Epstein-Barr panel studies and a chest x-ray.  Please give us an update on Monday how you are feeling whether improved or not.  Follow-up in 7 days or as needed.  Esperanza RichtersEdward Davionne Mastrangelo, PA-C

## 2018-06-28 NOTE — Patient Instructions (Signed)
I do think that by your description past week that you likely had the flu.  You had classic symptoms with rapid test that was negative.  Sometimes rapid flu test can give false negative results.  The nasal congestion and persisting fatigue I think is likely post flu.  Would recommend that you continue Flonase for nasal congestion and will try benzonatate for cough.  Sometimes secondary infection such as bronchitis, ear infections, sinus infections and pneumonia can occur post flu.  No obvious indication of this presently.  But I am going to provide you with a print prescription of azithromycin to start if you have any of the symptoms.  If by this coming Monday you still do not feel significantly improved would recommend that you get CBC, Epstein-Barr panel studies and a chest x-ray.  Please give [place] an update on Monday how you are feeling whether improved or not.  Follow-up in 7 days or as needed.

## 2018-08-03 MED FILL — AMLODIPINE BESYLATE 10 MG T: 10 | 90 days supply | Qty: 90 | Fill #1

## 2018-08-03 MED FILL — POTASSIUM CHLORIDE CRYS ER: 20 | 90 days supply | Qty: 90 | Fill #1

## 2018-08-04 ENCOUNTER — Other Ambulatory Visit: Payer: Self-pay | Admitting: Family Medicine

## 2018-08-04 DIAGNOSIS — F419 Anxiety disorder, unspecified: Secondary | ICD-10-CM

## 2018-08-04 NOTE — Telephone Encounter (Signed)
Requesting:xanax Contract: yes MCE:[id number] risk next screen 11/31/19 Last OV:06/28/18 Next OV:n/a Last Refill:01/31/18 #30-0rf Database:   Please advise

## 2018-09-04 MED FILL — LOSARTAN-HCTZ 100-25 MG TAB: 100-25 | 30 days supply | Qty: 30 | Fill #2

## 2018-12-08 ENCOUNTER — Ambulatory Visit (INDEPENDENT_AMBULATORY_CARE_PROVIDER_SITE_OTHER): Payer: PRIVATE HEALTH INSURANCE | Admitting: Family

## 2018-12-08 ENCOUNTER — Other Ambulatory Visit: Payer: Self-pay

## 2018-12-08 ENCOUNTER — Encounter: Payer: Self-pay | Admitting: Family

## 2018-12-08 VITALS — BP 138/69 | HR 64 | Temp 98.1°F | Resp 16 | Ht 63.5 in | Wt 150.6 lb

## 2018-12-08 DIAGNOSIS — Z0000 Encounter for general adult medical examination without abnormal findings: Secondary | ICD-10-CM | POA: Diagnosis not present

## 2018-12-08 DIAGNOSIS — I10 Essential (primary) hypertension: Secondary | ICD-10-CM | POA: Diagnosis not present

## 2018-12-08 DIAGNOSIS — F419 Anxiety disorder, unspecified: Secondary | ICD-10-CM | POA: Diagnosis not present

## 2018-12-08 DIAGNOSIS — R739 Hyperglycemia, unspecified: Secondary | ICD-10-CM | POA: Diagnosis not present

## 2018-12-08 LAB — HEPATIC FUNCTION PANEL
ALT: 13 U/L (ref 0–35)
AST: 18 U/L (ref 0–37)
Albumin: 4.7 g/dL (ref 3.5–5.2)
Alkaline Phosphatase: 82 U/L (ref 39–117)
Bilirubin, Direct: 0.1 mg/dL (ref 0.0–0.3)
Total Bilirubin: 0.6 mg/dL (ref 0.2–1.2)
Total Protein: 7.8 g/dL (ref 6.0–8.3)

## 2018-12-08 LAB — CBC WITH DIFFERENTIAL/PLATELET
Basophils Absolute: 0.1 10*3/uL (ref 0.0–0.1)
Basophils Relative: 0.8 % (ref 0.0–3.0)
Eosinophils Absolute: 0.1 10*3/uL (ref 0.0–0.7)
Eosinophils Relative: 1.8 % (ref 0.0–5.0)
HCT: 42.3 % (ref 36.0–46.0)
Hemoglobin: 13.6 g/dL (ref 12.0–15.0)
Lymphocytes Relative: 42.9 % (ref 12.0–46.0)
Lymphs Abs: 2.8 10*3/uL (ref 0.7–4.0)
MCHC: 32.1 g/dL (ref 30.0–36.0)
MCV: 77.3 fl — ABNORMAL LOW (ref 78.0–100.0)
Monocytes Absolute: 0.5 10*3/uL (ref 0.1–1.0)
Monocytes Relative: 7.9 % (ref 3.0–12.0)
Neutro Abs: 3 10*3/uL (ref 1.4–7.7)
Neutrophils Relative %: 46.6 % (ref 43.0–77.0)
Platelets: 289 10*3/uL (ref 150.0–400.0)
RBC: 5.47 Mil/uL — ABNORMAL HIGH (ref 3.87–5.11)
RDW: 15.4 % (ref 11.5–15.5)
WBC: 6.4 10*3/uL (ref 4.0–10.5)

## 2018-12-08 LAB — BASIC METABOLIC PANEL
BUN: 9 mg/dL (ref 6–23)
CO2: 32 mEq/L (ref 19–32)
Calcium: 9.9 mg/dL (ref 8.4–10.5)
Chloride: 100 mEq/L (ref 96–112)
Creatinine, Ser: 0.63 mg/dL (ref 0.40–1.20)
GFR: 97.84 mL/min (ref >60.00)
Glucose, Bld: 99 mg/dL (ref 70–99)
Potassium: 3.2 mEq/L — ABNORMAL LOW (ref 3.5–5.1)
Sodium: 141 mEq/L (ref 135–145)

## 2018-12-08 LAB — LIPID PANEL
Cholesterol: 236 mg/dL — ABNORMAL HIGH (ref 0–200)
HDL: 89.1 mg/dL (ref >39.00)
LDL Cholesterol: 131 mg/dL — ABNORMAL HIGH (ref 0–99)
NonHDL: 146.51
Total CHOL/HDL Ratio: 3
Triglycerides: 76 mg/dL (ref 0.0–149.0)
VLDL: 15.2 mg/dL (ref 0.0–40.0)

## 2018-12-08 LAB — TSH: TSH: 0.89 u[IU]/mL (ref 0.35–4.50)

## 2018-12-08 LAB — HEMOGLOBIN A1C: Hgb A1c MFr Bld: 5.9 % (ref 4.6–6.5)

## 2018-12-08 MED ORDER — AMLODIPINE BESYLATE 10 MG PO TABS: 10.0000 mg | ORAL_TABLET | Freq: Every day | ORAL | 1 refills | Status: DC

## 2018-12-08 MED ORDER — POTASSIUM CHLORIDE CRYS ER 20 MEQ PO TBCR: 20.0000 meq | EXTENDED_RELEASE_TABLET | Freq: Every day | ORAL | 1 refills | Status: DC

## 2018-12-08 MED ORDER — ALPRAZOLAM 0.5 MG PO TABS: ORAL_TABLET | ORAL | 2 refills | Status: DC

## 2018-12-08 MED ORDER — LOSARTAN POTASSIUM-HCTZ 100-25 MG PO TABS: 1.0000 | ORAL_TABLET | Freq: Every day | ORAL | 1 refills | Status: DC

## 2018-12-08 MED FILL — AMLODIPINE BESYLATE 10 MG T: 10 | 90 days supply | Qty: 90 | Fill #0

## 2018-12-08 MED FILL — POTASSIUM CHLORIDE CRYS ER: 20 | 90 days supply | Qty: 90 | Fill #0

## 2018-12-08 MED FILL — LOSARTAN-HCTZ 100-25 MG TAB: 100-25 | 30 days supply | Qty: 30 | Fill #0

## 2018-12-08 MED FILL — ALPRAZOLAM 0.5 MG TABS: 0.5 | 30 days supply | Qty: 30 | Fill #0

## 2018-12-08 NOTE — Progress Notes (Signed)
Subjective:    Patient ID: Anna Werner, female    DOB: 1961-02-25, 58 y.o.   MRN: [id number]  HPI  Patient presents today for complete physical.  Immunizations: tdap 2014, declines shingrix Diet: terrible Wt Readings from Last 3 Encounters:  12/08/18 150 lb 9.6 oz (68.3 kg)  06/28/18 147 lb 6.4 oz (66.9 kg)  11/17/17 159 lb 12.8 oz (72.5 kg)  Exercise: no formal exercise Colonoscopy: 2013 Dexa: 7/19 Pap Smear:  hysterectomy Mammogram: 09/30/17 Vision: scheduled Dental:  Up to date  HTN- maintained on hyzaar BP Readings from Last 3 Encounters:  12/08/18 138/69  06/28/18 116/82  11/17/17 132/74   Anxiety- use xanax prn anxiety/insomnia.   Hyperglycemia-  Lab Results  Component Value Date   HGBA1C 6.1 11/21/2017     Review of Systems  Constitutional: Negative for unexpected weight change.  HENT: Negative for hearing loss and rhinorrhea.   Eyes: Negative for visual disturbance.  Respiratory: Negative for cough.   Cardiovascular: Negative for leg swelling.  Gastrointestinal: Positive for constipation. Negative for diarrhea.  Genitourinary: Negative for dysuria, frequency and hematuria.  Musculoskeletal: Negative for arthralgias and myalgias.  Skin: Negative for rash.  Neurological: Negative for headaches.  Hematological: Negative for adenopathy.  Psychiatric/Behavioral:       Denies depression/anxiety   Past Medical History:  Diagnosis Date  . Anxiety   . Arthritis   . Depression   . Glaucoma   . HSV-2 infection   . Hypertension   . Osteopenia 11/20/2017  . Thyroid nodule    Dr Elvera LennoxGherghe     Social History   Socioeconomic History  . Marital status: Divorced    Spouse name: Not on file  . Number of children: Not on file  . Years of education: Not on file  . Highest education level: Not on file  Occupational History  . Not on file  Social Needs  . Financial resource strain: Not on file  . Food insecurity    Worry: Not on file    Inability: Not  on file  . Transportation needs    Medical: Not on file    Non-medical: Not on file  Tobacco Use  . Smoking status: Former Smoker    Types: Cigarettes  . Smokeless tobacco: Never Used  Substance and Sexual Activity  . Alcohol use: Not Currently    Alcohol/week: 0.0 standard drinks    Comment: 0  . Drug use: No  . Sexual activity: Yes  Lifestyle  . Physical activity    Days per week: Not on file    Minutes per session: Not on file  . Stress: Not on file  Relationships  . Social Musicianconnections    Talks on phone: Not on file    Gets together: Not on file    Attends religious service: Not on file    Active member of club or organization: Not on file    Attends meetings of clubs or organizations: Not on file    Relationship status: Not on file  . Intimate partner violence    Fear of current or ex partner: Not on file    Emotionally abused: Not on file    Physically abused: Not on file    Forced sexual activity: Not on file  Other Topics Concern  . 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    Enjoys reading and spending time with grandson    Lives alone       Past Surgical History:  Procedure Laterality Date  . ABDOMINAL HYSTERECTOMY    . BIOPSY THYROID  05/10/13  . BREAST BIOPSY Left ?  Marland Kitchen BREAST LUMPECTOMY Right 2012?  . HERNIA REPAIR      Family History  Problem Relation Age of Onset  . Alcohol abuse Mother   . Developmental delay Brother     No Known Allergies  Current Outpatient Medications on File Prior to Visit  Medication Sig Dispense Refill  . calcium carbonate 200 MG capsule Take 500 mg by mouth 2 (two) times daily with a meal.    . cetirizine (ZYRTEC) 10 MG tablet Take 10 mg by mouth as needed.     . cholecalciferol (VITAMIN D) 1000 UNITS tablet Take 1,000 Units by mouth daily.    Marland Kitchen MAGNESIUM CITRATE PO Take by mouth. Take  1/4 teaspoon once a day.    . Vitamin D-Vitamin K (VITAMIN K2-VITAMIN D3 PO) Take 1 capsule by mouth daily.     No current facility-administered medications on file prior to visit.     BP 138/69 (BP Location: Right Arm, Patient Position: Sitting, Cuff Size: Small)   Pulse 64   Temp 98.1 F (36.7 C) (Oral)   Resp 16   Ht 5' 3.5" (1.613 m)   Wt 150 lb 9.6 oz (68.3 kg)   SpO2 100%   BMI 26.26 kg/m       Objective:   Physical Exam   Physical Exam  Constitutional: She is oriented to person, place, and time. She appears well-developed and well-nourished. No distress.  HENT:  Head: Normocephalic and atraumatic.  Right Ear: Tympanic membrane and ear canal normal.  Left Ear: Tympanic membrane and ear canal normal.  Mouth/Throat: deferred, pt wearing mask for covid-19 precautions Eyes: Pupils are equal, round, and reactive to light. No scleral icterus.  Neck: Normal range of motion. No thyromegaly present.  Cardiovascular: Normal rate and regular rhythm.   No murmur heard. Pulmonary/Chest: Effort normal and breath sounds normal. No respiratory distress. He has no wheezes. She has no rales. She exhibits no tenderness.  Abdominal: Soft. Bowel sounds are normal. She exhibits no distension and no mass. There is no tenderness. There is no rebound and no guarding.  Musculoskeletal: She exhibits no edema.  Lymphadenopathy:    She has no cervical adenopathy.  Neurological: She is alert and oriented to person, place, and time. She has normal patellar reflexes. She exhibits normal muscle tone. Coordination normal.  Skin: Skin is warm and dry.  Psychiatric: She has a normal mood and affect. Her behavior is normal. Judgment and thought content normal.  Breasts: declined Pelvic: deferred Assessment & Plan:   Preventative care- discussed healthy diet and regular exercise. Obtain routine lab work. Pt will schedule mammogram. Declines shingrix. dexa and colo are up to date.  HTN- bp stable,  continue current meds.  Anxiety- stable with prn xanax. Obtain follow up UDS.  Hyperglycemia- obtain a1c    Assessment & Plan:

## 2018-12-08 NOTE — Patient Instructions (Signed)
Please complete lab work prior to leaving. Try to focus on healthy diet and adding regular exercise such as walking.

## 2018-12-09 LAB — PAIN MGMT, PROFILE 8 W/CONF, U
6 Acetylmorphine: NEGATIVE ng/mL
Alcohol Metabolites: NEGATIVE ng/mL (ref <500)
Amphetamines: NEGATIVE ng/mL
Benzodiazepines: NEGATIVE ng/mL
Buprenorphine, Urine: NEGATIVE ng/mL
Cocaine Metabolite: NEGATIVE ng/mL
Creatinine: 25.2 mg/dL
MDMA: NEGATIVE ng/mL
Marijuana Metabolite: NEGATIVE ng/mL
Opiates: NEGATIVE ng/mL
Oxidant: NEGATIVE ug/mL
Oxycodone: NEGATIVE ng/mL
pH: 6.8 (ref 4.5–9.0)

## 2018-12-11 ENCOUNTER — Other Ambulatory Visit: Payer: Self-pay

## 2018-12-11 ENCOUNTER — Telehealth: Payer: Self-pay | Admitting: Family

## 2018-12-11 DIAGNOSIS — E876 Hypokalemia: Secondary | ICD-10-CM

## 2018-12-11 NOTE — Telephone Encounter (Signed)
Also, cholesterol is above goal. Please work on low fat/low cholesterol diet and exercise.

## 2018-12-11 NOTE — Telephone Encounter (Signed)
Patient called back, was scheduled for labs in 2 weeks at her request because she is going out of town.   Is there any other instructions on how to take her potassium since she wont be able to come next week?

## 2018-12-11 NOTE — Telephone Encounter (Signed)
No further instructions. tks

## 2018-12-11 NOTE — Telephone Encounter (Signed)
lvm for patient to call back for this results 

## 2018-12-11 NOTE — Telephone Encounter (Signed)
Please contact pt and let her know that her potassium is low.  I would like her to take 2 tabs today and tomorrow, then one tab once daily. Repeat bmet in 1 week.

## 2018-12-13 ENCOUNTER — Telehealth: Payer: Self-pay | Admitting: Family

## 2018-12-13 ENCOUNTER — Encounter: Payer: Self-pay | Admitting: Family

## 2018-12-13 DIAGNOSIS — E876 Hypokalemia: Secondary | ICD-10-CM

## 2018-12-13 DIAGNOSIS — E559 Vitamin D deficiency, unspecified: Secondary | ICD-10-CM

## 2018-12-13 MED ORDER — LOSARTAN POTASSIUM-HCTZ 100-25 MG PO TABS: 1.0000 | ORAL_TABLET | Freq: Every day | ORAL | 1 refills | Status: DC

## 2018-12-13 NOTE — Telephone Encounter (Signed)
Medication Refill - Medication: losartan-hydrochlorothiazide (HYZAAR) 100-25 MG tablet    Has the patient contacted their pharmacy? No. Pt states she lost the pills and is requesting some be sent in for her. Please advise.  (Agent: If no, request that the patient contact the pharmacy for the refill.) (Agent: If yes, when and what did the pharmacy advise?)  Preferred Pharmacy (with phone number or street name):  [facility], [address] [place] [address]  Phone: [phone] Fax: [phone]  Not a 24 hour pharmacy; exact hours not known.     Agent: Please be advised that RX refills may take up to 3 business days. We ask that you follow-up with your pharmacy.

## 2018-12-13 NOTE — Telephone Encounter (Signed)
Rx sent 

## 2018-12-26 ENCOUNTER — Other Ambulatory Visit: Payer: PRIVATE HEALTH INSURANCE

## 2019-01-02 ENCOUNTER — Encounter: Payer: Self-pay | Admitting: Family

## 2019-01-03 ENCOUNTER — Other Ambulatory Visit: Payer: Self-pay

## 2019-01-03 ENCOUNTER — Other Ambulatory Visit (INDEPENDENT_AMBULATORY_CARE_PROVIDER_SITE_OTHER): Payer: PRIVATE HEALTH INSURANCE

## 2019-01-03 DIAGNOSIS — E559 Vitamin D deficiency, unspecified: Secondary | ICD-10-CM | POA: Diagnosis not present

## 2019-01-03 DIAGNOSIS — E876 Hypokalemia: Secondary | ICD-10-CM

## 2019-01-04 LAB — BASIC METABOLIC PANEL
BUN: 13 mg/dL (ref 6–23)
CO2: 24 mEq/L (ref 19–32)
Calcium: 9.6 mg/dL (ref 8.4–10.5)
Chloride: 98 mEq/L (ref 96–112)
Creatinine, Ser: 0.64 mg/dL (ref 0.40–1.20)
GFR: 96.05 mL/min (ref >60.00)
Glucose, Bld: 80 mg/dL (ref 70–99)
Potassium: 4 mEq/L (ref 3.5–5.1)
Sodium: 137 mEq/L (ref 135–145)

## 2019-01-06 LAB — VITAMIN D 1,25 DIHYDROXY
Vitamin D 1, 25 (OH)2 Total: 39 pg/mL (ref 18–72)
Vitamin D2 1, 25 (OH)2: <8 pg/mL
Vitamin D3 1, 25 (OH)2: 39 pg/mL

## 2019-01-09 ENCOUNTER — Encounter: Payer: Self-pay | Admitting: Family

## 2019-01-09 NOTE — Telephone Encounter (Signed)
Results reviewed with patient. She has additional imaging scheduled at [facility] on Friday.

## 2019-01-12 LAB — HM MAMMOGRAPHY

## 2019-01-25 ENCOUNTER — Ambulatory Visit: Payer: PRIVATE HEALTH INSURANCE

## 2019-03-16 MED FILL — POTASSIUM CHLORIDE CRYS ER: 20 | 90 days supply | Qty: 90 | Fill #1

## 2019-03-16 MED FILL — ALPRAZolam 0.5 MG TABS: 0.5 | 30 days supply | Qty: 30 | Fill #1

## 2019-03-28 ENCOUNTER — Telehealth: Payer: Self-pay | Admitting: Medical

## 2019-03-28 ENCOUNTER — Ambulatory Visit: Payer: PRIVATE HEALTH INSURANCE | Admitting: Medical

## 2019-03-28 ENCOUNTER — Encounter: Payer: Self-pay | Admitting: Medical

## 2019-03-28 ENCOUNTER — Other Ambulatory Visit: Payer: Self-pay

## 2019-03-28 NOTE — Progress Notes (Signed)
   Subjective:    Patient ID: Anna Werner, female    DOB: 03/26/1961, 59 y.o.   MRN: [id number]  HPI  I was running behind. Pt left stating she had emergency. Jasmine teary eyed. No charge. Will ask staff to call and apologize that was late and offer appointment.  Review of Systems     Objective:   Physical Exam        Assessment & Plan:

## 2019-03-28 NOTE — Telephone Encounter (Signed)
Pt left for emergency. I was running behind. Will you apologize for me and offer other appointment. If with me try to offer her 8 am or 1 pm. Not last appointment or later in morning or evening as then may have to wait longer.

## 2019-05-14 ENCOUNTER — Other Ambulatory Visit: Payer: Self-pay | Admitting: Family

## 2019-05-14 MED FILL — AMLODIPINE BESYLATE 10 MG T: 10 | 90 days supply | Qty: 90 | Fill #1

## 2019-06-14 MED FILL — POTASSIUM CHLORIDE CRYS ER: 20 | 90 days supply | Qty: 90 | Fill #0

## 2019-06-15 ENCOUNTER — Ambulatory Visit: Payer: PRIVATE HEALTH INSURANCE | Admitting: Family

## 2019-10-04 MED FILL — POTASSIUM CHLORIDE CRYS ER: 20 | 90 days supply | Qty: 90 | Fill #1

## 2020-01-18 ENCOUNTER — Encounter: Payer: Self-pay | Admitting: Family

## 2020-01-18 ENCOUNTER — Other Ambulatory Visit: Payer: Self-pay | Admitting: Family

## 2020-01-18 ENCOUNTER — Ambulatory Visit (INDEPENDENT_AMBULATORY_CARE_PROVIDER_SITE_OTHER): Payer: PRIVATE HEALTH INSURANCE | Admitting: Family

## 2020-01-18 ENCOUNTER — Other Ambulatory Visit: Payer: Self-pay

## 2020-01-18 VITALS — BP 184/82 | HR 52 | Temp 98.4°F | Resp 16 | Ht 63.5 in | Wt 146.8 lb

## 2020-01-18 DIAGNOSIS — I10 Essential (primary) hypertension: Secondary | ICD-10-CM | POA: Diagnosis not present

## 2020-01-18 DIAGNOSIS — R739 Hyperglycemia, unspecified: Secondary | ICD-10-CM | POA: Diagnosis not present

## 2020-01-18 DIAGNOSIS — Z6825 Body mass index (BMI) 25.0-25.9, adult: Secondary | ICD-10-CM

## 2020-01-18 DIAGNOSIS — Z0000 Encounter for general adult medical examination without abnormal findings: Secondary | ICD-10-CM

## 2020-01-18 MED ORDER — POTASSIUM CHLORIDE CRYS ER 20 MEQ PO TBCR: 20.0000 meq | EXTENDED_RELEASE_TABLET | Freq: Every day | ORAL | 1 refills | Status: DC

## 2020-01-18 MED ORDER — AMLODIPINE BESYLATE 10 MG PO TABS: 10.0000 mg | ORAL_TABLET | Freq: Every day | ORAL | 1 refills | Status: DC

## 2020-01-18 MED ORDER — LOSARTAN POTASSIUM-HCTZ 100-25 MG PO TABS: 1.0000 | ORAL_TABLET | Freq: Every day | ORAL | 1 refills | Status: DC

## 2020-01-18 MED FILL — LOSARTAN-HCTZ 100-25 MG TAB: 100-25 | 90 days supply | Qty: 90 | Fill #0

## 2020-01-18 MED FILL — POTASSIUM CHLORIDE CRYS ER: 20 | 90 days supply | Qty: 90 | Fill #0

## 2020-01-18 MED FILL — AMLODIPINE BESYLATE 10 MG T: 10 | 90 days supply | Qty: 90 | Fill #0

## 2020-01-18 NOTE — Progress Notes (Signed)
Subjective:    Patient ID: Anna Werner, female    DOB: 1960-09-17, 59 y.o.   MRN: [id number]  HPI  Patient presents today for complete physical.  Immunizations: declines flu shot  Diet: trying to eat healthier Exercise: occasional Colonoscopy:  2014- polyps,  Dexa: 2019 Pap Smear: hysterectomy Mammogram:   Dental: up to date Vision: up to date  Wt Readings from Last 3 Encounters:  01/18/20 146 lb 12.8 oz (66.6 kg)  03/28/19 151 lb (68.5 kg)  12/08/18 150 lb 9.6 oz (68.3 kg)        Review of Systems  Constitutional: Negative for unexpected weight change.  HENT: Negative for hearing loss and rhinorrhea.   Eyes: Negative for visual disturbance.  Respiratory: Negative for cough and shortness of breath.   Cardiovascular: Negative for chest pain.  Gastrointestinal: Positive for constipation (uses miralax or senokot prn). Negative for diarrhea and nausea.  Genitourinary: Negative for dysuria, frequency and hematuria.  Musculoskeletal: Negative for arthralgias and myalgias.  Skin: Negative for rash.  Neurological: Negative for headaches.  Hematological: Negative for adenopathy.  Psychiatric/Behavioral:       Denies depression/anxiety   Past Medical History:  Diagnosis Date  . Anxiety   . Arthritis   . Depression   . Glaucoma   . HSV-2 infection   . Hypertension   . Osteopenia 11/20/2017  . Thyroid nodule    Dr Elvera Lennox     Social History   Socioeconomic History  . Marital status: Divorced    Spouse name: Not on file  . Number of children: Not on file  . Years of education: Not on file  . Highest education level: Not on file  Occupational History  . Not on file  Tobacco Use  . Smoking status: Former Smoker    Types: Cigarettes  . Smokeless tobacco: Never Used  Substance and Sexual Activity  . Alcohol use: Not Currently    Alcohol/week: 0.0 standard drinks    Comment: 0  . Drug use: No  . Sexual activity: Yes  Other Topics Concern  . Not on file    Social History Narrative   Regular exercise: no   Caffeine use: coffee daily   Daughter in [place]- has son   Son in [place]   Works as an Public house manager at [employer].    Enjoys reading and spending time with grandson    Lives alone      Social Determinants of Health   Financial Resource Strain:   . Difficulty of Paying Living Expenses: Not on file  Food Insecurity:   . Worried About Programme researcher, broadcasting/film/video in the Last Year: Not on file  . Ran Out of Food in the Last Year: Not on file  Transportation Needs:   . Lack of Transportation (Medical): Not on file  . Lack of Transportation (Non-Medical): Not on file  Physical Activity:   . Days of Exercise per Week: Not on file  . Minutes of Exercise per Session: Not on file  Stress:   . Feeling of Stress : Not on file  Social Connections:   . Frequency of Communication with Friends and Family: Not on file  . Frequency of Social Gatherings with Friends and Family: Not on file  . Attends Religious Services: Not on file  . Active Member of Clubs or Organizations: Not on file  . Attends Banker Meetings: Not on file  . Marital Status: Not on file  Intimate Partner Violence:   . Fear of  Current or Ex-Partner: Not on file  . Emotionally Abused: Not on file  . Physically Abused: Not on file  . Sexually Abused: Not on file    Past Surgical History:  Procedure Laterality Date  . ABDOMINAL HYSTERECTOMY    . BIOPSY THYROID  05/10/13  . BREAST BIOPSY Left ?  Marland Kitchen BREAST LUMPECTOMY Right 2012?  . HERNIA REPAIR      Family History  Problem Relation Age of Onset  . Alcohol abuse Mother   . Developmental delay Brother     No Known Allergies  Current Outpatient Medications on File Prior to Visit  Medication Sig Dispense Refill  . ALPRAZolam (XANAX) 0.5 MG tablet 1 tablet by mouth once daily as needed for anxiety 30 tablet 2  . amLODipine (NORVASC) 10 MG tablet Take 1 tablet (10 mg total) by mouth daily. 90 tablet 1  . calcium  carbonate 200 MG capsule Take 500 mg by mouth 2 (two) times daily with a meal.    . cetirizine (ZYRTEC) 10 MG tablet Take 10 mg by mouth as needed.     . cholecalciferol (VITAMIN D) 1000 UNITS tablet Take 1,000 Units by mouth daily.    Marland Kitchen losartan-hydrochlorothiazide (HYZAAR) 100-25 MG tablet Take 1 tablet by mouth daily. 90 tablet 1  . MAGNESIUM CITRATE PO Take by mouth. Take 1/4 teaspoon once a day.    . potassium chloride SA (KLOR-CON) 20 MEQ tablet TAKE 1 TABLET (20 MEQ TOTAL) BY MOUTH DAILY. 90 tablet 1  . Vitamin D-Vitamin K (VITAMIN K2-VITAMIN D3 PO) Take 1 capsule by mouth daily.     No current facility-administered medications on file prior to visit.    BP (!) 184/82 (BP Location: Right Arm, Patient Position: Sitting, Cuff Size: Small)   Pulse (!) 52   Temp 98.4 F (36.9 C) (Oral)   Resp 16   Ht 5' 3.5" (1.613 m)   Wt 146 lb 12.8 oz (66.6 kg)   SpO2 100%   BMI 25.60 kg/m       Objective:   Physical Exam Constitutional:      Appearance: She is well-developed.  HENT:     Head: Normocephalic and atraumatic.     Right Ear: Tympanic membrane and ear canal normal.     Left Ear: Tympanic membrane and ear canal normal.  Neck:     Thyroid: No thyromegaly.  Cardiovascular:     Rate and Rhythm: Normal rate and regular rhythm.     Heart sounds: Normal heart sounds. No murmur heard.   Pulmonary:     Effort: Pulmonary effort is normal. No respiratory distress.     Breath sounds: Normal breath sounds. No wheezing.  Musculoskeletal:     Cervical back: Neck supple.  Skin:    General: Skin is warm and dry.  Neurological:     Mental Status: She is alert and oriented to person, place, and time.  Psychiatric:        Behavior: Behavior normal.        Thought Content: Thought content normal.        Judgment: Judgment normal.           Assessment & Plan:  Preventative care- declines flu shot, shingrix and covid vaccine. Patient was counseled on importance of vaccination  against covid-19.  Mammogram is scheduled. Refer for colonoscopy.  Dexa up to date. Obtain routine lab work.   Discussed healthy diet, exercise.  She is requesting referral to nutrition. Referral has been placed.  Lab  Results  Component Value Date   HGBA1C 5.9 12/08/2018   HTN- has not been taking amlodipine.  Advised pt to restart and follow back up in 2 weeks for bp recheck.  BP Readings from Last 3 Encounters:  01/18/20 (!) 184/82  03/28/19 132/81  12/08/18 138/69   This visit occurred during the SARS-CoV-2 public health emergency.  Safety protocols were in place, including screening questions prior to the visit, additional usage of staff PPE, and extensive cleaning of exam room while observing appropriate contact time as indicated for disinfecting solutions.

## 2020-01-18 NOTE — Patient Instructions (Addendum)
Please complete lab work prior to leaving.    Preventive Care 57-57 Years Old, Female Preventive care refers to visits with your health care provider and lifestyle choices that can promote health and wellness. This includes:  A yearly physical exam. This may also be called an annual well check.  Regular dental visits and eye exams.  Immunizations.  Screening for certain conditions.  Healthy lifestyle choices, such as eating a healthy diet, getting regular exercise, not using drugs or products that contain nicotine and tobacco, and limiting alcohol use. What can I expect for my preventive care visit? Physical exam Your health care provider will check your:  Height and weight. This may be used to calculate body mass index (BMI), which tells if you are at a healthy weight.  Heart rate and blood pressure.  Skin for abnormal spots. Counseling Your health care provider may ask you questions about your:  Alcohol, tobacco, and drug use.  Emotional well-being.  Home and relationship well-being.  Sexual activity.  Eating habits.  Work and work environment.  Method of birth control.  Menstrual cycle.  Pregnancy history. What immunizations do I need?  Influenza (flu) vaccine  This is recommended every year. Tetanus, diphtheria, and pertussis (Tdap) vaccine  You may need a Td booster every 10 years. Varicella (chickenpox) vaccine  You may need this if you have not been vaccinated. Zoster (shingles) vaccine  You may need this after age 57. Measles, mumps, and rubella (MMR) vaccine  You may need at least one dose of MMR if you were born in 1957 or later. You may also need a second dose. Pneumococcal conjugate (PCV13) vaccine  You may need this if you have certain conditions and were not previously vaccinated. Pneumococcal polysaccharide (PPSV23) vaccine  You may need one or two doses if you smoke cigarettes or if you have certain conditions. Meningococcal conjugate  (MenACWY) vaccine  You may need this if you have certain conditions. Hepatitis A vaccine  You may need this if you have certain conditions or if you travel or work in places where you may be exposed to hepatitis A. Hepatitis B vaccine  You may need this if you have certain conditions or if you travel or work in places where you may be exposed to hepatitis B. Haemophilus influenzae type b (Hib) vaccine  You may need this if you have certain conditions. Human papillomavirus (HPV) vaccine  If recommended by your health care provider, you may need three doses over 6 months. You may receive vaccines as individual doses or as more than one vaccine together in one shot (combination vaccines). Talk with your health care provider about the risks and benefits of combination vaccines. What tests do I need? Blood tests  Lipid and cholesterol levels. These may be checked every 5 years, or more frequently if you are over 50 years old.  Hepatitis C test.  Hepatitis B test. Screening  Lung cancer screening. You may have this screening every year starting at age 57 if you have a 30-pack-year history of smoking and currently smoke or have quit within the past 15 years.  Colorectal cancer screening. All adults should have this screening starting at age 57 and continuing until age 75. Your health care provider may recommend screening at age 45 if you are at increased risk. You will have tests every 1-10 years, depending on your results and the type of screening test.  Diabetes screening. This is done by checking your blood sugar (glucose) after you have   not eaten for a while (fasting). You may have this done every 1-3 years.  Mammogram. This may be done every 1-2 years. Talk with your health care provider about when you should start having regular mammograms. This may depend on whether you have a family history of breast cancer.  BRCA-related cancer screening. This may be done if you have a family  history of breast, ovarian, tubal, or peritoneal cancers.  Pelvic exam and Pap test. This may be done every 3 years starting at age 57. Starting at age 57, this may be done every 5 years if you have a Pap test in combination with an HPV test. Other tests  Sexually transmitted disease (STD) testing.  Bone density scan. This is done to screen for osteoporosis. You may have this scan if you are at high risk for osteoporosis. Follow these instructions at home: Eating and drinking  Eat a diet that includes fresh fruits and vegetables, whole grains, lean protein, and low-fat dairy.  Take vitamin and mineral supplements as recommended by your health care provider.  Do not drink alcohol if: ? Your health care provider tells you not to drink. ? You are pregnant, may be pregnant, or are planning to become pregnant.  If you drink alcohol: ? Limit how much you have to 0-1 drink a day. ? Be aware of how much alcohol is in your drink. In the [place] one drink equals one 12 oz bottle of beer (355 mL), one 5 oz glass of wine (148 mL), or one 1 oz glass of hard liquor (44 mL). Lifestyle  Take daily care of your teeth and gums.  Stay active. Exercise for at least 30 minutes on 5 or more days each week.  Do not use any products that contain nicotine or tobacco, such as cigarettes, e-cigarettes, and chewing tobacco. If you need help quitting, ask your health care provider.  If you are sexually active, practice safe sex. Use a condom or other form of birth control (contraception) in order to prevent pregnancy and STIs (sexually transmitted infections).  If told by your health care provider, take low-dose aspirin daily starting at age 57. What's next?  Visit your health care provider once a year for a well check visit.  Ask your health care provider how often you should have your eyes and teeth checked.  Stay up to date on all vaccines. This information is not intended to replace advice given to you  by your health care provider. Make sure you discuss any questions you have with your health care provider. Document Revised: 01/05/2018 Document Reviewed: 01/05/2018 Elsevier Patient Education  2020 [employer] Inc[employer]  

## 2020-01-19 LAB — CBC WITH DIFFERENTIAL/PLATELET
Absolute Monocytes: 469 cells/uL (ref 200–950)
Basophils Absolute: 20 cells/uL (ref 0–200)
Basophils Relative: 0.3 %
Eosinophils Absolute: 101 cells/uL (ref 15–500)
Eosinophils Relative: 1.5 %
HCT: 41.8 % (ref 35.0–45.0)
Hemoglobin: 13.3 g/dL (ref 11.7–15.5)
Lymphs Abs: 2131 cells/uL (ref 850–3900)
MCH: 24.5 pg — ABNORMAL LOW (ref 27.0–33.0)
MCHC: 31.8 g/dL — ABNORMAL LOW (ref 32.0–36.0)
MCV: 77 fL — ABNORMAL LOW (ref 80.0–100.0)
MPV: 11.5 fL (ref 7.5–12.5)
Monocytes Relative: 7 %
Neutro Abs: 3980 cells/uL (ref 1500–7800)
Neutrophils Relative %: 59.4 %
Platelets: 256 10*3/uL (ref 140–400)
RBC: 5.43 10*6/uL — ABNORMAL HIGH (ref 3.80–5.10)
RDW: 15 % (ref 11.0–15.0)
Total Lymphocyte: 31.8 %
WBC: 6.7 10*3/uL (ref 3.8–10.8)

## 2020-01-19 LAB — BASIC METABOLIC PANEL
BUN: 10 mg/dL (ref 7–25)
CO2: 29 mmol/L (ref 20–32)
Calcium: 9.5 mg/dL (ref 8.6–10.4)
Chloride: 100 mmol/L (ref 98–110)
Creat: 0.65 mg/dL (ref 0.50–1.05)
Glucose, Bld: 112 mg/dL — ABNORMAL HIGH (ref 65–99)
Potassium: 3.7 mmol/L (ref 3.5–5.3)
Sodium: 141 mmol/L (ref 135–146)

## 2020-01-19 LAB — LIPID PANEL
Cholesterol: 236 mg/dL — ABNORMAL HIGH (ref <200)
HDL: 90 mg/dL (ref >=50)
LDL Cholesterol (Calc): 129 mg/dL (calc) — ABNORMAL HIGH
Non-HDL Cholesterol (Calc): 146 mg/dL (calc) — ABNORMAL HIGH (ref <130)
Total CHOL/HDL Ratio: 2.6 (calc) (ref <5.0)
Triglycerides: 74 mg/dL (ref <150)

## 2020-01-19 LAB — HEPATIC FUNCTION PANEL
AG Ratio: 1.7 (calc) (ref 1.0–2.5)
ALT: 11 U/L (ref 6–29)
AST: 15 U/L (ref 10–35)
Albumin: 4.6 g/dL (ref 3.6–5.1)
Alkaline phosphatase (APISO): 77 U/L (ref 37–153)
Bilirubin, Direct: 0.1 mg/dL (ref 0.0–0.2)
Globulin: 2.7 g/dL (calc) (ref 1.9–3.7)
Indirect Bilirubin: 0.6 mg/dL (calc) (ref 0.2–1.2)
Total Bilirubin: 0.7 mg/dL (ref 0.2–1.2)
Total Protein: 7.3 g/dL (ref 6.1–8.1)

## 2020-01-19 LAB — HEMOGLOBIN A1C
Hgb A1c MFr Bld: 4.7 % of total Hgb (ref <5.7)
Mean Plasma Glucose: 88 (calc)
eAG (mmol/L): 4.9 (calc)

## 2020-01-19 LAB — TSH: TSH: 0.9 mIU/L (ref 0.40–4.50)

## 2020-01-25 LAB — HM MAMMOGRAPHY

## 2020-02-01 ENCOUNTER — Ambulatory Visit: Payer: PRIVATE HEALTH INSURANCE | Admitting: Family

## 2020-02-27 ENCOUNTER — Encounter: Payer: Self-pay | Admitting: Family

## 2020-03-10 ENCOUNTER — Other Ambulatory Visit: Payer: Self-pay | Admitting: Family

## 2020-03-10 DIAGNOSIS — F419 Anxiety disorder, unspecified: Secondary | ICD-10-CM

## 2020-03-10 NOTE — Telephone Encounter (Signed)
Last RX:12-08-18 Last OV:01-18-20 Next OV: none scheduled UDS: 10-08-19 CSC:10-08-19 CSR:no discrepancies

## 2020-03-11 ENCOUNTER — Other Ambulatory Visit: Payer: Self-pay | Admitting: Family

## 2020-03-11 MED FILL — ALPRAZolam 0.5 MG TABS: 0.5 | 30 days supply | Qty: 30 | Fill #0

## 2020-04-18 MED FILL — AMLODIPINE BESYLATE 10 MG T: 10 | 90 days supply | Qty: 90 | Fill #1

## 2020-04-18 MED FILL — LOSARTAN-HCTZ 100-25 MG TAB: 100-25 | 90 days supply | Qty: 90 | Fill #1

## 2020-04-18 MED FILL — POTASSIUM CHLORIDE CRYS ER: 20 | 90 days supply | Qty: 90 | Fill #1

## 2024-05-15 ENCOUNTER — Other Ambulatory Visit (HOSPITAL_COMMUNITY): Payer: Self-pay | Admitting: *Deleted

## 2024-05-15 DIAGNOSIS — E7800 Pure hypercholesterolemia, unspecified: Secondary | ICD-10-CM

## 2024-05-18 ENCOUNTER — Ambulatory Visit (HOSPITAL_BASED_OUTPATIENT_CLINIC_OR_DEPARTMENT_OTHER)
Admission: RE | Admit: 2024-05-18 | Discharge: 2024-05-18 | Disposition: A | Payer: PRIVATE HEALTH INSURANCE | Source: Ambulatory Visit | Attending: Nurse Practitioner | Admitting: Nurse Practitioner

## 2024-05-18 DIAGNOSIS — E7800 Pure hypercholesterolemia, unspecified: Secondary | ICD-10-CM | POA: Insufficient documentation
